# Patient Record
Sex: Male | Born: 1985 | Race: Black or African American | Hispanic: No | Marital: Single | Smoking: Former smoker
Health system: Southern US, Community
[De-identification: ages and names within clinical notes are randomized; demographics above are authoritative.]

## PROBLEM LIST (undated history)

## (undated) DIAGNOSIS — J45909 Unspecified asthma, uncomplicated: Secondary | ICD-10-CM

## (undated) DIAGNOSIS — R7303 Prediabetes: Secondary | ICD-10-CM

## (undated) HISTORY — DX: Unspecified asthma, uncomplicated: J45.909

## (undated) HISTORY — PX: NO PAST SURGERIES: SHX2092

---
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
## 2007-01-03 ENCOUNTER — Ambulatory Visit: Payer: Self-pay | Admitting: Internal Medicine

## 2007-01-03 ENCOUNTER — Encounter (INDEPENDENT_AMBULATORY_CARE_PROVIDER_SITE_OTHER): Payer: Self-pay | Admitting: Nurse Practitioner

## 2007-01-03 LAB — CONVERTED CEMR LAB
ALT: 20 units/L (ref 0–53)
AST: 13 units/L (ref 0–37)
Albumin: 4.5 g/dL (ref 3.5–5.2)
Alkaline Phosphatase: 65 units/L (ref 39–117)
BUN: 8 mg/dL (ref 6–23)
Basophils Absolute: 0.1 10*3/uL (ref 0.0–0.1)
Basophils Relative: 1 % (ref 0–1)
CO2: 24 meq/L (ref 19–32)
Calcium: 9.9 mg/dL (ref 8.4–10.5)
Chloride: 103 meq/L (ref 96–112)
Cholesterol: 189 mg/dL (ref 0–200)
Creatinine, Ser: 0.8 mg/dL (ref 0.40–1.50)
Eosinophils Absolute: 0.5 10*3/uL (ref 0.2–0.7)
Eosinophils Relative: 4 % (ref 0–5)
Glucose, Bld: 90 mg/dL (ref 70–99)
HCT: 46.9 % (ref 39.0–52.0)
HDL: 61 mg/dL (ref >39)
Hemoglobin: 15.4 g/dL (ref 13.0–17.0)
LDL Cholesterol: 87 mg/dL (ref 0–99)
Lymphocytes Relative: 20 % (ref 12–46)
Lymphs Abs: 2.6 10*3/uL (ref 0.7–4.0)
MCHC: 32.8 g/dL (ref 30.0–36.0)
MCV: 83.2 fL (ref 78.0–100.0)
Monocytes Absolute: 1 10*3/uL (ref 0.1–1.0)
Monocytes Relative: 8 % (ref 3–12)
Neutro Abs: 8.8 10*3/uL — ABNORMAL HIGH (ref 1.7–7.7)
Neutrophils Relative %: 68 % (ref 43–77)
Platelets: 287 10*3/uL (ref 150–400)
Potassium: 4.1 meq/L (ref 3.5–5.3)
RBC: 5.64 M/uL (ref 4.22–5.81)
RDW: 12.9 % (ref 11.5–15.5)
Sodium: 140 meq/L (ref 135–145)
TSH: 2.323 microintl units/mL (ref 0.350–5.50)
Total Bilirubin: 0.5 mg/dL (ref 0.3–1.2)
Total CHOL/HDL Ratio: 3.1
Total Protein: 7.8 g/dL (ref 6.0–8.3)
Triglycerides: 204 mg/dL — ABNORMAL HIGH (ref <150)
VLDL: 41 mg/dL — ABNORMAL HIGH (ref 0–40)
WBC: 13 10*3/uL — ABNORMAL HIGH (ref 4.0–10.5)

## 2007-01-15 ENCOUNTER — Ambulatory Visit: Payer: Self-pay | Admitting: *Deleted

## 2008-05-12 ENCOUNTER — Ambulatory Visit: Payer: Self-pay | Admitting: Internal Medicine

## 2009-03-16 ENCOUNTER — Ambulatory Visit: Payer: Self-pay | Admitting: Family Medicine

## 2013-02-21 ENCOUNTER — Ambulatory Visit (INDEPENDENT_AMBULATORY_CARE_PROVIDER_SITE_OTHER): Payer: BC Managed Care – PPO | Admitting: Family Medicine

## 2013-02-21 ENCOUNTER — Ambulatory Visit: Payer: BC Managed Care – PPO

## 2013-02-21 VITALS — BP 158/80 | HR 71 | Temp 98.3°F | Resp 16 | Ht 67.0 in | Wt 271.0 lb

## 2013-02-21 DIAGNOSIS — R079 Chest pain, unspecified: Secondary | ICD-10-CM

## 2013-02-21 DIAGNOSIS — R05 Cough: Secondary | ICD-10-CM

## 2013-02-21 DIAGNOSIS — R519 Headache, unspecified: Secondary | ICD-10-CM

## 2013-02-21 DIAGNOSIS — D72829 Elevated white blood cell count, unspecified: Secondary | ICD-10-CM

## 2013-02-21 DIAGNOSIS — R059 Cough, unspecified: Secondary | ICD-10-CM

## 2013-02-21 DIAGNOSIS — R0789 Other chest pain: Secondary | ICD-10-CM

## 2013-02-21 DIAGNOSIS — H538 Other visual disturbances: Secondary | ICD-10-CM

## 2013-02-21 DIAGNOSIS — R071 Chest pain on breathing: Secondary | ICD-10-CM

## 2013-02-21 DIAGNOSIS — R51 Headache: Secondary | ICD-10-CM

## 2013-02-21 LAB — POCT CBC
Granulocyte percent: 71.9 %G (ref 37–80)
HCT, POC: 45.6 % (ref 43.5–53.7)
Hemoglobin: 14.4 g/dL (ref 14.1–18.1)
Lymph, poc: 3.7 — AB (ref 0.6–3.4)
MCH, POC: 28.6 pg (ref 27–31.2)
MCHC: 31.6 g/dL — AB (ref 31.8–35.4)
MCV: 90.5 fL (ref 80–97)
MID (cbc): 0.8 (ref 0–0.9)
MPV: 9.6 fL (ref 0–99.8)
POC Granulocyte: 11.6 — AB (ref 2–6.9)
POC LYMPH PERCENT: 22.9 %L (ref 10–50)
POC MID %: 5.2 %M (ref 0–12)
Platelet Count, POC: 252 10*3/uL (ref 142–424)
RBC: 5.04 M/uL (ref 4.69–6.13)
RDW, POC: 13.1 %
WBC: 16.2 10*3/uL — AB (ref 4.6–10.2)

## 2013-02-21 LAB — GLUCOSE, POCT (MANUAL RESULT ENTRY): POC Glucose: 92 mg/dl (ref 70–99)

## 2013-02-21 NOTE — Patient Instructions (Signed)
Your blood count was slightly elevated, but no fever in office, chest xray appears ok.  This may be due to a viral illness, but will also discuss your symptoms with a neurologist. Recheck with Dr. Neva SeatGreene tomorrow morning to recheck your blood count, and other symptoms. Fluids, rest for now. If any worsening of chest pain, headache, further vision change, weakness, or any other new or worsening symptoms overnight - proceed directly to the emergency room. We may need to have you evaluated by an ophthalmologist tomorrow, but can discuss this further at that time.

## 2013-02-21 NOTE — Progress Notes (Signed)
Subjective:    Patient ID: Glenn Dorsey, male    DOB: 1985/10/12, 28 y.o.   MRN: [id number]  HPI Glenn Dorsey is a 28 y.o. male  "feverish symptoms on L side of body" hot and cold spells, coughing up mucus, runny nose/congestion on L. Started yesterday am. No measured fever. Some headache on L side and blurry vision on left - started yesterday at around 4:30am. Not watering, just blurry. No slurred speech. No arm/leg weakness, but warm/fever sensation into L leg. No coordination difficulty.  No nasal discharge.  Sore on L side of chest, but able to release this soreness with arm movement. No prior hx of headaches or migraines known. HA has improved some today, but blurry vision is same. No dizziness, no syncope.   Hx of asthma, no recent inhaler needed in past year or two.  No meds at home.   No rx meds.   Tx: otc cough medicine since yesterday and today. Unknown ingredients, no known hx of htn, and unknown if decongestant in cold med he is taking.   Did have flu vaccine this year.  SH: mom and dad with similar sx's.    There are no active problems to display for this patient.  Past Medical History  Diagnosis Date  . Asthma    History reviewed. No pertinent past surgical history. Not on File Prior to Admission medications   Medication Sig Start Date End Date Taking? Authorizing Provider  cetirizine (ZYRTEC) 10 MG tablet Take 10 mg by mouth daily.   Yes Historical Provider, MD   History   Social History  . Marital Status: Single    Spouse Name: N/A    Number of Children: N/A  . Years of Education: N/A   Occupational History  . Not on file.   Social History Main Topics  . Smoking status: Current Some Day Smoker  . Smokeless tobacco: Not on file  . Alcohol Use: No  . Drug Use: No  . Sexual Activity: Not on file   Other Topics Concern  . Not on file   Social History Narrative  . No narrative on file      Review of Systems  Constitutional: Positive for fever  (subjecive. ) and chills. Negative for appetite change.  HENT: Positive for congestion (on left only. ), hearing loss (hearing less at times on left side since yesterday at onset of other symptoms - feels congested. ) and sinus pressure. Negative for facial swelling and trouble swallowing.   Eyes: Positive for visual disturbance (blurry on left, no photophobia. ). Negative for pain, discharge and redness.  Respiratory: Positive for cough (clear -yellow mucus, mostly clear. ) and wheezing. Negative for shortness of breath.   Cardiovascular: Positive for chest pain (L sided only withour radiation, and reproducible with palpation, L shoulder mvmt. ). Negative for palpitations and leg swelling.  Genitourinary: Negative for difficulty urinating.  Musculoskeletal: Positive for myalgias (L side of chest, notes when elevating L arm overhead, relieves L ear sensation - feels like can hear better,  less blurry in  L eye and less headache with  arm elevation. ). Negative for neck stiffness.  Skin: Negative for rash.  Neurological: Positive for headaches. Negative for dizziness, tremors, seizures, syncope, facial asymmetry, speech difficulty, weakness, light-headedness and numbness.  fundoscopic exam difficult, but no apparent papilledema.      Objective:   Physical Exam  Vitals reviewed. Constitutional: He is oriented to person, place, and time. He appears well-developed and well-nourished.  HENT:  Head: Normocephalic and atraumatic.  Right Ear: Tympanic membrane, external ear and ear canal normal.  Left Ear: Tympanic membrane, external ear and ear canal normal.  Nose: No rhinorrhea.  Mouth/Throat: Oropharynx is clear and moist and mucous membranes are normal. No oropharyngeal exudate or posterior oropharyngeal erythema.  Eyes: Conjunctivae and EOM are normal. Pupils are equal, round, and reactive to light.  Neck: Trachea normal and full passive range of motion without pain. Neck supple. No JVD present.  No muscular tenderness present. Carotid bruit is not present. No Brudzinski's sign noted.  Cardiovascular: Normal rate, regular rhythm, normal heart sounds, intact distal pulses and normal pulses.   No extrasystoles are present.  No murmur heard. Diastolic murmurs: EKG:   Able to reproduce pain along R pectoralis/chest wall.   Pulmonary/Chest: Effort normal. No accessory muscle usage. Not tachypneic. No respiratory distress. He has no decreased breath sounds. He has wheezes (distant, end expiratory, lower. ). He has no rhonchi. He has no rales.  Abdominal: Soft. There is no tenderness.  Musculoskeletal: He exhibits no edema.  Lymphadenopathy:    He has no cervical adenopathy.  Neurological: He is alert and oriented to person, place, and time. He has normal strength. He displays no tremor. No cranial nerve deficit or sensory deficit. He displays a negative Romberg sign. Coordination and gait normal. GCS eye subscore is 4. GCS verbal subscore is 5. GCS motor subscore is 6.  [place] prehospital scale negative x 3. nonfocal exam, no apparent pronator drift, nl finger to nose, and heel to toe.   Skin: Skin is warm and dry. No rash noted.  Psychiatric: He has a normal mood and affect. His behavior is normal.   Filed Vitals:   02/21/13 1407  BP: 158/80  Pulse: 71  Temp: 98.3 F (36.8 C)  TempSrc: Oral  Resp: 16  Height: 5\' 7"  (1.702 m)  Weight: 271 lb (122.925 kg)  SpO2: 97%   Results for orders placed in visit on 02/21/13  POCT CBC      Result Value Ref Range   WBC 16.2 (*) 4.6 - 10.2 K/uL   Lymph, poc 3.7 (*) 0.6 - 3.4   POC LYMPH PERCENT 22.9  10 - 50 %L   MID (cbc) 0.8  0 - 0.9   POC MID % 5.2  0 - 12 %M   POC Granulocyte 11.6 (*) 2 - 6.9   Granulocyte percent 71.9  37 - 80 %G   RBC 5.04  4.69 - 6.13 M/uL   Hemoglobin 14.4  14.1 - 18.1 g/dL   HCT, POC 60.4  54.0 - 53.7 %   MCV 90.5  80 - 97 fL   MCH, POC 28.6  27 - 31.2 pg   MCHC 31.6 (*) 31.8 - 35.4 g/dL   RDW, POC 98.1       Platelet Count, POC 252  142 - 424 K/uL   MPV 9.6  0 - 99.8 fL  GLUCOSE, POCT (MANUAL RESULT ENTRY)      Result Value Ref Range   POC Glucose 92  70 - 99 mg/dl   [facility] reading (PRIMARY) by  Dr. Neva Seat: CXR: no apparent infiltrate. Stat overread obtained - normal CXR.   EKG: sr, early repol, without apparent acute changes.       Assessment & Plan:   Glenn Dorsey is a 27 y.o. male Headache(784.0) - Plan: EKG 12-Lead, POCT CBC, POCT glucose (manual entry)  Blurry vision - Plan: EKG 12-Lead, POCT CBC, POCT  glucose (manual entry)  Cough - Plan: DG Chest 2 View  Chest pain, unspecified - Plan: DG Chest 2 View  Left-sided chest wall pain  Left-sided headache  Leukocytosis, unspecified - Plan: DG Chest 2 View   Suspected viral syndrome with cough, myalgias, HA.  Afebrile, supple neck exam, reassuring CXR and EKG. Leukocytosis, but afebrile in office.  L sided vision change, L sided HA, L sided myalgias vs dysesthesias, but no focal deficit or focal neuro findings. Discussed with Neurologist after patient visit. DDX includes underlying MS with flair of sx's with viral illness.  Plan on discussing MRI, but recheck in am to recheck CBC and exam.    Meds ordered this encounter  Medications  . cetirizine (ZYRTEC) 10 MG tablet    Sig: Take 10 mg by mouth daily.   Patient Instructions  Your blood count was slightly elevated, but no fever in office, chest xray appears ok.  This may be due to a viral illness, but will also discuss your symptoms with a neurologist. Recheck with Dr. Neva Seat tomorrow morning to recheck your blood count, and other symptoms. Fluids, rest for now. If any worsening of chest pain, headache, further vision change, weakness, or any other new or worsening symptoms overnight - proceed directly to the emergency room. We may need to have you evaluated by an ophthalmologist tomorrow, but can discuss this further at that time.

## 2013-02-22 ENCOUNTER — Telehealth: Payer: Self-pay | Admitting: Family Medicine

## 2013-02-22 NOTE — Telephone Encounter (Signed)
Left message for patient to return call.  Call and check status. He was supposed to follow up with me today to repeat blood count and discuss his other symptoms form last night. I did call a neurologist about his symptoms and planned to discuss further workup of these as recommended by Neuro today. Can be seen tonight before 6pm or return in am, but let me know when he will return so I can advise other provider if I am not here.

## 2013-02-25 NOTE — Telephone Encounter (Signed)
Left message on machine to call.

## 2013-02-26 NOTE — Telephone Encounter (Signed)
Spoke with pt and he is planning on returning Thursday or Friday with Dr. Perrin MalteseGuest. He states he is starting to feel much better, but will recheck with you.

## 2013-03-04 ENCOUNTER — Ambulatory Visit (INDEPENDENT_AMBULATORY_CARE_PROVIDER_SITE_OTHER): Payer: BC Managed Care – PPO | Admitting: Internal Medicine

## 2013-03-04 VITALS — BP 138/82 | HR 77 | Temp 98.0°F | Resp 16 | Ht 67.0 in | Wt 263.0 lb

## 2013-03-04 DIAGNOSIS — R05 Cough: Secondary | ICD-10-CM

## 2013-03-04 DIAGNOSIS — R079 Chest pain, unspecified: Secondary | ICD-10-CM

## 2013-03-04 DIAGNOSIS — J40 Bronchitis, not specified as acute or chronic: Secondary | ICD-10-CM

## 2013-03-04 DIAGNOSIS — R059 Cough, unspecified: Secondary | ICD-10-CM

## 2013-03-04 MED ORDER — HYDROCODONE-ACETAMINOPHEN 7.5-325 MG/15ML PO SOLN: 5.0000 mL | Freq: Four times a day (QID) | ORAL | Status: DC | PRN

## 2013-03-04 MED ORDER — AZITHROMYCIN 500 MG PO TABS: 500.0000 mg | ORAL_TABLET | Freq: Every day | ORAL | Status: DC

## 2013-03-04 NOTE — Progress Notes (Signed)
   Subjective:    Patient ID: Glenn Dorsey, male    DOB: [birth year], 28 y.o.   MRN: [id number]  HPI    Review of Systems     Objective:   Physical Exam        Assessment & Plan:

## 2013-03-04 NOTE — Patient Instructions (Signed)
Acute Bronchitis Bronchitis is inflammation of the airways that extend from the windpipe into the lungs (bronchi). The inflammation often causes mucus to develop. This leads to a cough, which is the most common symptom of bronchitis.  In acute bronchitis, the condition usually develops suddenly and goes away over time, usually in a couple weeks. Smoking, allergies, and asthma can make bronchitis worse. Repeated episodes of bronchitis may cause further lung problems.  CAUSES Acute bronchitis is most often caused by the same virus that causes a cold. The virus can spread from person to person (contagious).  SIGNS AND SYMPTOMS   Cough.   Fever.   Coughing up mucus.   Body aches.   Chest congestion.   Chills.   Shortness of breath.   Sore throat.  DIAGNOSIS  Acute bronchitis is usually diagnosed through a physical exam. Tests, such as chest X-rays, are sometimes done to rule out other conditions.  TREATMENT  Acute bronchitis usually goes away in a couple weeks. Often times, no medical treatment is necessary. Medicines are sometimes given for relief of fever or cough. Antibiotics are usually not needed but may be prescribed in certain situations. In some cases, an inhaler may be recommended to help reduce shortness of breath and control the cough. A cool mist vaporizer may also be used to help thin bronchial secretions and make it easier to clear the chest.  HOME CARE INSTRUCTIONS  Get plenty of rest.   Drink enough fluids to keep your urine clear or pale yellow (unless you have a medical condition that requires fluid restriction). Increasing fluids may help thin your secretions and will prevent dehydration.   Only take over-the-counter or prescription medicines as directed by your health care provider.   Avoid smoking and secondhand smoke. Exposure to cigarette smoke or irritating chemicals will make bronchitis worse. If you are a smoker, consider using nicotine gum or skin  patches to help control withdrawal symptoms. Quitting smoking will help your lungs heal faster.   Reduce the chances of another bout of acute bronchitis by washing your hands frequently, avoiding people with cold symptoms, and trying not to touch your hands to your mouth, nose, or eyes.   Follow up with your health care provider as directed.  SEEK MEDICAL CARE IF: Your symptoms do not improve after 1 week of treatment.  SEEK IMMEDIATE MEDICAL CARE IF:  You develop an increased fever or chills.   You have chest pain.   You have severe shortness of breath.  You have bloody sputum.   You develop dehydration.  You develop fainting.  You develop repeated vomiting.  You develop a severe headache. MAKE SURE YOU:   Understand these instructions.  Will watch your condition.  Will get help right away if you are not doing well or get worse. Document Released: 02/04/2004 Document Revised: 08/29/2012 Document Reviewed: 06/19/2012 [employer] Patient Information 2014 [employer].  

## 2013-03-04 NOTE — Progress Notes (Signed)
   Subjective:    Patient ID: Glenn Dorsey, male    DOB: 06-Oct-1985, 28 y.o.   MRN: [id number]  HPI Pt is here for a follow-up, he was seen by Dr. Neva SeatGreene on the 12th. He was originally seen for "chest congestion" and fever, and was noted to have a high WBC. He is still coughing quite a bit, he thinks the congestion is concentrated on the left side of his chest. He states this cough is productive, it was yellow tinged a couple of days ago but has since become clear. He states this coughing is interrupting his sleep. He states he has noticed some shortness of breath, especially with activity. He says this was worse a week ago, but is still bothering him some. He has noticed some pain in his ribs, that he believes is associated to coughing. Pt states he wants an antibiotic, he thinks it has continued for too long. He has taken castor oil, to "help clean his system". He has been taking mucinex, he states this has helped some.  Pt states the blurred vision he was experiencing at the last visit, seems to have resolved.   Glucose 92, wbc 16,000 last week, cxr normal   Review of Systems     Objective:   Physical Exam  Vitals reviewed. Constitutional: He is oriented to person, place, and time. He appears well-developed and well-nourished. No distress.  HENT:  Head: Normocephalic.  Right Ear: External ear normal.  Left Ear: External ear normal.  Nose: Nose normal.  Mouth/Throat: Oropharynx is clear and moist.  Eyes: Conjunctivae and EOM are normal. Pupils are equal, round, and reactive to light.  Neck: Normal range of motion. Neck supple.  Cardiovascular: Normal rate, regular rhythm and normal heart sounds.   Pulmonary/Chest: Effort normal. Not tachypneic. He has no decreased breath sounds. He has no wheezes. He has rhonchi. He has no rales.  Lymphadenopathy:    He has no cervical adenopathy.  Neurological: He is alert and oriented to person, place, and time. He exhibits normal muscle tone.  Coordination normal.          Assessment & Plan:  Bronchitis Zith 500mg Leandro Reasoner/lortab

## 2013-03-07 ENCOUNTER — Telehealth: Payer: Self-pay

## 2013-03-07 DIAGNOSIS — R059 Cough, unspecified: Secondary | ICD-10-CM

## 2013-03-07 DIAGNOSIS — R05 Cough: Secondary | ICD-10-CM

## 2013-03-07 NOTE — Telephone Encounter (Signed)
Patient states that at his last visit he did not have enough money to get his hydrocodone filled and Dr. Perrin MalteseGuest rewrote his RX for half of the amount so that patient could go ahead and purchase it. Patient states that he needs a refill on the next dose and would like to try and get it by Friday.  [phone]

## 2013-03-10 MED ORDER — HYDROCODONE-ACETAMINOPHEN 7.5-325 MG/15ML PO SOLN: 10.0000 mL | Freq: Four times a day (QID) | ORAL | Status: DC | PRN

## 2013-03-12 ENCOUNTER — Ambulatory Visit (INDEPENDENT_AMBULATORY_CARE_PROVIDER_SITE_OTHER): Payer: BC Managed Care – PPO | Admitting: Internal Medicine

## 2013-03-12 VITALS — BP 135/80 | HR 58 | Temp 98.2°F | Resp 18 | Ht 67.25 in | Wt 265.0 lb

## 2013-03-12 DIAGNOSIS — J40 Bronchitis, not specified as acute or chronic: Secondary | ICD-10-CM

## 2013-03-12 NOTE — Progress Notes (Signed)
   Subjective:    Patient ID: Glenn Dorsey, male    DOB: Jan 24, 1985, 28 y.o.   MRN: [id number]  HPI 28 year old male present for an acute bronchitis re-check that he was diagnosed with last Monday. He says he is feeling around 90-95 percent better. The cough has pretty much gone away. He states he has a little chest congestion left, but it's not bothering him too much. Overall he says he is feeling good; no other concerns.    Review of Systems     Objective:   Physical Exam  Constitutional: He is oriented to person, place, and time. He appears well-developed and well-nourished.  HENT:  Head: Normocephalic.  Right Ear: External ear normal.  Left Ear: External ear normal.  Nose: Nose normal.  Mouth/Throat: Oropharynx is clear and moist.  Eyes: EOM are normal. Pupils are equal, round, and reactive to light.  Neck: Normal range of motion. Neck supple.  Cardiovascular: Normal rate, regular rhythm and normal heart sounds.   Pulmonary/Chest: Effort normal. Not tachypneic. He has no decreased breath sounds. He has no wheezes. He has rhonchi. He has no rales.  Lymphadenopathy:    He has no cervical adenopathy.  Neurological: He is alert and oriented to person, place, and time. He exhibits normal muscle tone. Coordination normal.  Psychiatric: He has a normal mood and affect.          Assessment & Plan:  Resolving bronchitis

## 2013-03-12 NOTE — Progress Notes (Signed)
   Subjective:    Patient ID: Glenn Dorsey, male    DOB: 04/08/1985, 27 y.o.   MRN: [id number]  HPI    Review of Systems     Objective:   Physical Exam        Assessment & Plan:   

## 2013-03-18 ENCOUNTER — Ambulatory Visit (INDEPENDENT_AMBULATORY_CARE_PROVIDER_SITE_OTHER): Payer: BC Managed Care – PPO | Admitting: Family Medicine

## 2013-03-18 VITALS — BP 150/80 | HR 87 | Temp 99.1°F | Resp 16 | Ht 67.5 in | Wt 265.0 lb

## 2013-03-18 DIAGNOSIS — K0889 Other specified disorders of teeth and supporting structures: Secondary | ICD-10-CM

## 2013-03-18 DIAGNOSIS — K029 Dental caries, unspecified: Secondary | ICD-10-CM

## 2013-03-18 DIAGNOSIS — K089 Disorder of teeth and supporting structures, unspecified: Secondary | ICD-10-CM

## 2013-03-18 DIAGNOSIS — R6884 Jaw pain: Secondary | ICD-10-CM

## 2013-03-18 LAB — POCT CBC
Granulocyte percent: 70.8 %G (ref 37–80)
HCT, POC: 43.9 % (ref 43.5–53.7)
Hemoglobin: 14.2 g/dL (ref 14.1–18.1)
Lymph, poc: 2.9 (ref 0.6–3.4)
MCH, POC: 28.3 pg (ref 27–31.2)
MCHC: 32.3 g/dL (ref 31.8–35.4)
MCV: 87.6 fL (ref 80–97)
MID (cbc): 0.6 (ref 0–0.9)
MPV: 9.5 fL (ref 0–99.8)
POC Granulocyte: 8.6 — AB (ref 2–6.9)
POC LYMPH PERCENT: 24.3 % (ref 10–50)
POC MID %: 4.9 %M (ref 0–12)
Platelet Count, POC: 285 10*3/uL (ref 142–424)
RBC: 5.01 M/uL (ref 4.69–6.13)
RDW, POC: 13 %
WBC: 12.1 10*3/uL — AB (ref 4.6–10.2)

## 2013-03-18 MED ORDER — MAGIC MOUTHWASH W/LIDOCAINE: 5.0000 mL | Freq: Three times a day (TID) | ORAL | Status: DC | PRN

## 2013-03-18 MED ORDER — AMOXICILLIN 500 MG PO CAPS: 500.0000 mg | ORAL_CAPSULE | Freq: Three times a day (TID) | ORAL | Status: DC

## 2013-03-18 NOTE — Patient Instructions (Signed)
Dental Caries   Dental caries (also called tooth decay) is the most common oral disease. It can occur at any age, but is more common in children and young adults.   HOW DENTAL CARIES DEVELOPS   The process of decay begins when bacteria and foods (particularly sugars and starches) combine in your mouth to produce plaque. Plaque is a substance that sticks to the hard, outer surface of a tooth (enamel). The bacteria in plaque produce acids that attack enamel. These acids may also attack the root surface of a tooth (cementum) if it is exposed. Repeated attacks dissolve these surfaces and create holes in the tooth (cavities). If left untreated, the acids destroy the other layers of the tooth.   RISK FACTORS  · Frequent sipping of sugary beverages.    · Frequent snacking on sugary and starchy foods, especially those that easily get stuck in the teeth.    · Poor oral hygiene.    · Dry mouth.    · Substance abuse such as methamphetamine abuse.    · Broken or poor-fitting dental restorations.    · Eating disorders.    · Gastroesophageal reflux disease (GERD).    · Certain radiation treatments to the head and neck.  SYMPTOMS  In the early stages of dental caries, symptoms are seldom present. Sometimes white, chalky areas may be seen on the enamel or other tooth layers. In later stages, symptoms may include:  · Pits and holes on the enamel.  · Toothache after sweet, hot, or cold foods or drinks are consumed.  · Pain around the tooth.  · Swelling around the tooth.  DIAGNOSIS   Most of the time, dental caries is detected during a regular dental checkup. A diagnosis is made after a thorough medical and dental history is taken and the surfaces of your teeth are checked for signs of dental caries. Sometimes special instruments, such as lasers, are used to check for dental caries. Dental X-ray exams may be taken so that areas not visible to the eye (such as between the contact areas of the teeth) can be checked for cavities.    TREATMENT   If dental caries is in its early stages, it may be reversed with a fluoride treatment or an application of a remineralizing agent at the dental office. Thorough brushing and flossing at home is needed to aid these treatments. If it is in its later stages, treatment depends on the location and extent of tooth destruction:   · If a small area of the tooth has been destroyed, the destroyed area will be removed and cavities will be filled with a material such as gold, silver amalgam, or composite resin.    · If a large area of the tooth has been destroyed, the destroyed area will be removed and a cap (crown) will be fitted over the remaining tooth structure.    · If the center part of the tooth (pulp) is affected, a procedure called a root canal will be needed before a filling or crown can be placed.    · If most of the tooth has been destroyed, the tooth may need to be pulled (extracted).  HOME CARE INSTRUCTIONS  You can prevent, stop, or reverse dental caries at home by practicing good oral hygiene. Good oral hygiene includes:  · Thoroughly cleaning your teeth at least twice a day with a toothbrush and dental floss.    · Using a fluoride toothpaste. A fluoride mouth rinse may also be used if recommended by your dentist or health care provider.    ·   Restricting the amount of sugary and starchy foods and sugary liquids you consume.    · Avoiding frequent snacking on these foods and sipping of these liquids.    · Keeping regular visits with a dentist for checkups and cleanings.  PREVENTION   · Practice good oral hygiene.  · Consider a dental sealant. A dental sealant is a coating material that is applied by your dentist to the pits and grooves of teeth. The sealant prevents food from being trapped in them. It may protect the teeth for several years.  · Ask about fluoride supplements if you live in a community without fluorinated water or with water that has a low fluoride content. Use fluoride supplements  as directed by your dentist or health care provider.  · Allow fluoride varnish applications to teeth if directed by your dentist or health care provider.  Document Released: 09/18/2001 Document Revised: 08/29/2012 Document Reviewed: 12/30/2011  [employer]® Patient Information ©2014 [employer].

## 2013-03-18 NOTE — Progress Notes (Signed)
Chief Complaint:  Chief Complaint  Patient presents with  . Dental Pain    left side x 3 day  . Rash  . Fatigue    HPI: Glenn Dorsey is a 28 y.o. male who is here for  3 day hx of left sided denta pain, jaw pain, and metallic taste in mouth  It goes down his mouth and into his stoamch. He has not had fevers or chills but feels flushed. Sensitive tooth  with liquids  Past Medical History  Diagnosis Date  . Asthma    History reviewed. No pertinent past surgical history. History   Social History  . Marital Status: Single    Spouse Name: N/A    Number of Children: N/A  . Years of Education: N/A   Social History Main Topics  . Smoking status: Current Some Day Smoker  . Smokeless tobacco: None  . Alcohol Use: No  . Drug Use: No  . Sexual Activity: None   Other Topics Concern  . None   Social History Narrative  . None   Family History  Problem Relation Age of Onset  . Diabetes Mother   . Diabetes Father    No Known Allergies Prior to Admission medications   Medication Sig Start Date End Date Taking? Authorizing Provider  azithromycin (ZITHROMAX) 500 MG tablet Take 1 tablet (500 mg total) by mouth daily. 03/04/13   Jonita Albeehris W Guest, MD  cetirizine (ZYRTEC) 10 MG tablet Take 10 mg by mouth daily.    Historical Provider, MD  HYDROcodone-acetaminophen (HYCET) 7.5-325 mg/15 ml solution Take 5 mLs by mouth every 6 (six) hours as needed (or cough). 03/04/13   Jonita Albeehris W Guest, MD  HYDROcodone-acetaminophen (HYCET) 7.5-325 mg/15 ml solution Take 10-15 mLs by mouth every 6 (six) hours as needed. 03/10/13   Jonita Albeehris W Guest, MD     ROS: The patient denies fevers, chills, night sweats, unintentional weight loss, chest pain, palpitations, wheezing, dyspnea on exertion, nausea, vomiting, abdominal pain, dysuria, hematuria, melena, numbness, weakness, or tingling.   All other systems have been reviewed and were otherwise negative with the exception of those mentioned in the HPI and as  above.    PHYSICAL EXAM: Filed Vitals:   03/18/13 1057  BP: 150/80  Pulse: 87  Temp: 99.1 F (37.3 C)  Resp: 16   Filed Vitals:   03/18/13 1057  Height: 5' 7.5" (1.715 m)  Weight: 265 lb (120.203 kg)   Body mass index is 40.87 kg/(m^2).  General: Alert, no acute distress HEENT:  Normocephalic, atraumatic, oropharynx patent. EOMI, PERRLA, Tm nl, + left molar dental caries, large; no e/o abscess currently, jaw is not warm , no dc Cardiovascular:  Regular rate and rhythm, no rubs murmurs or gallops.  No Carotid bruits, radial pulse intact. No pedal edema.  Respiratory: Clear to auscultation bilaterally.  No wheezes, rales, or rhonchi.  No cyanosis, no use of accessory musculature GI: No organomegaly, abdomen is soft and non-tender, positive bowel sounds.  No masses. Skin: No rashes. Neurologic: Facial musculature symmetric. Psychiatric: Patient is appropriate throughout our interaction. Lymphatic: No cervical lymphadenopathy Musculoskeletal: Gait intact.   LABS: Results for orders placed in visit on 03/18/13  POCT CBC      Result Value Ref Range   WBC 12.1 (*) 4.6 - 10.2 K/uL   Lymph, poc 2.9  0.6 - 3.4   POC LYMPH PERCENT 24.3  10 - 50 %L   MID (cbc) 0.6  0 - 0.9  POC MID % 4.9  0 - 12 %M   POC Granulocyte 8.6 (*) 2 - 6.9   Granulocyte percent 70.8  37 - 80 %G   RBC 5.01  4.69 - 6.13 M/uL   Hemoglobin 14.2  14.1 - 18.1 g/dL   HCT, POC 16.1  09.6 - 53.7 %   MCV 87.6  80 - 97 fL   MCH, POC 28.3  27 - 31.2 pg   MCHC 32.3  31.8 - 35.4 g/dL   RDW, POC 04.5     Platelet Count, POC 285  142 - 424 K/uL   MPV 9.5  0 - 99.8 fL     EKG/XRAY:   Primary read interpreted by Dr. Conley Rolls at [facility].   ASSESSMENT/PLAN: Encounter Diagnoses  Name Primary?  Marland Kitchen Tooth pain Yes  . Jaw pain    ? Tooth decay/  Early formation abscess Rx magic mouthwash Rx Amoxcaillin 500 mg TID x 10 days, go see dentist F/u prn  Gross sideeffects, risk and benefits, and alternatives of medications  d/w patient. Patient is aware that all medications have potential sideeffects and we are unable to predict every sideeffect or drug-drug interaction that may occur.  Hamilton Capri PHUONG, DO 03/18/2013 12:26 PM

## 2013-06-05 NOTE — Telephone Encounter (Signed)
Opened in error

## 2016-01-04 IMAGING — CR DG CHEST 2V
2 series · 2 of 2 positions shown · non-contrast
Comparison: None.

CLINICAL DATA: Fever, cough, congestion

EXAM:
CHEST  2 VIEW

[PA]
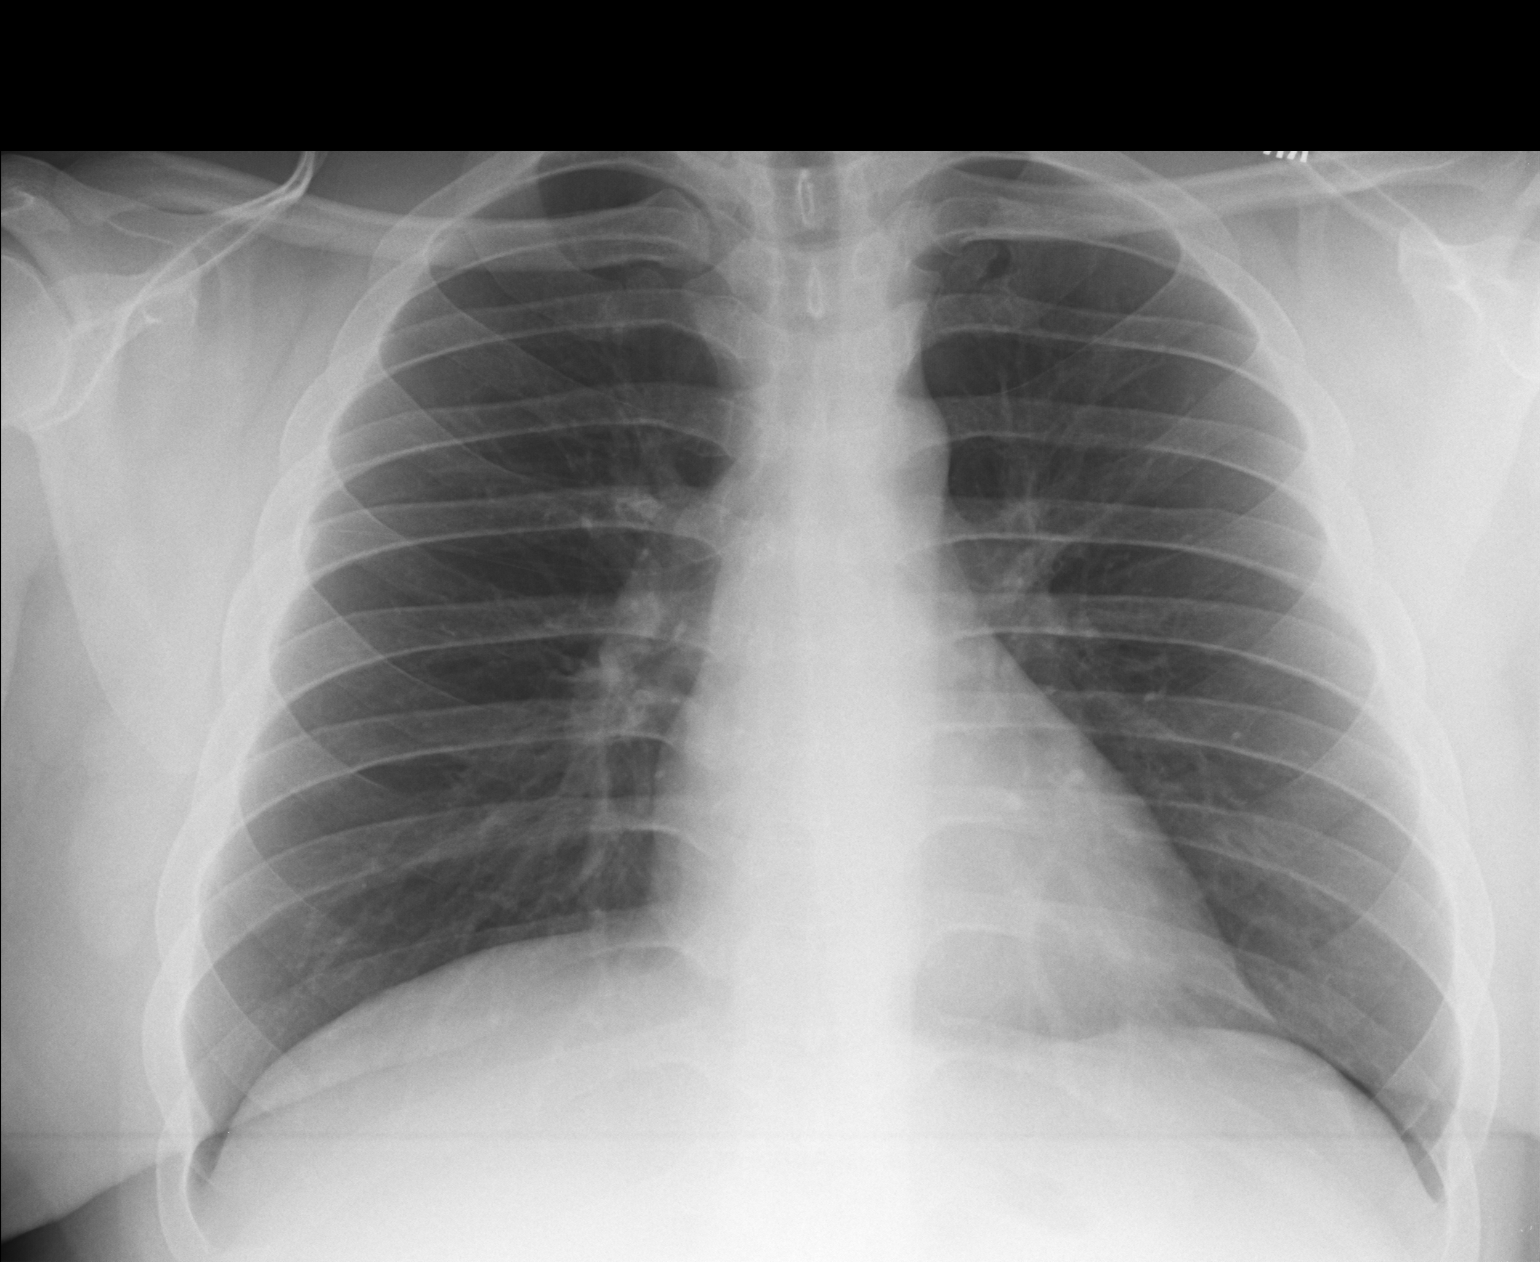

[lateral]
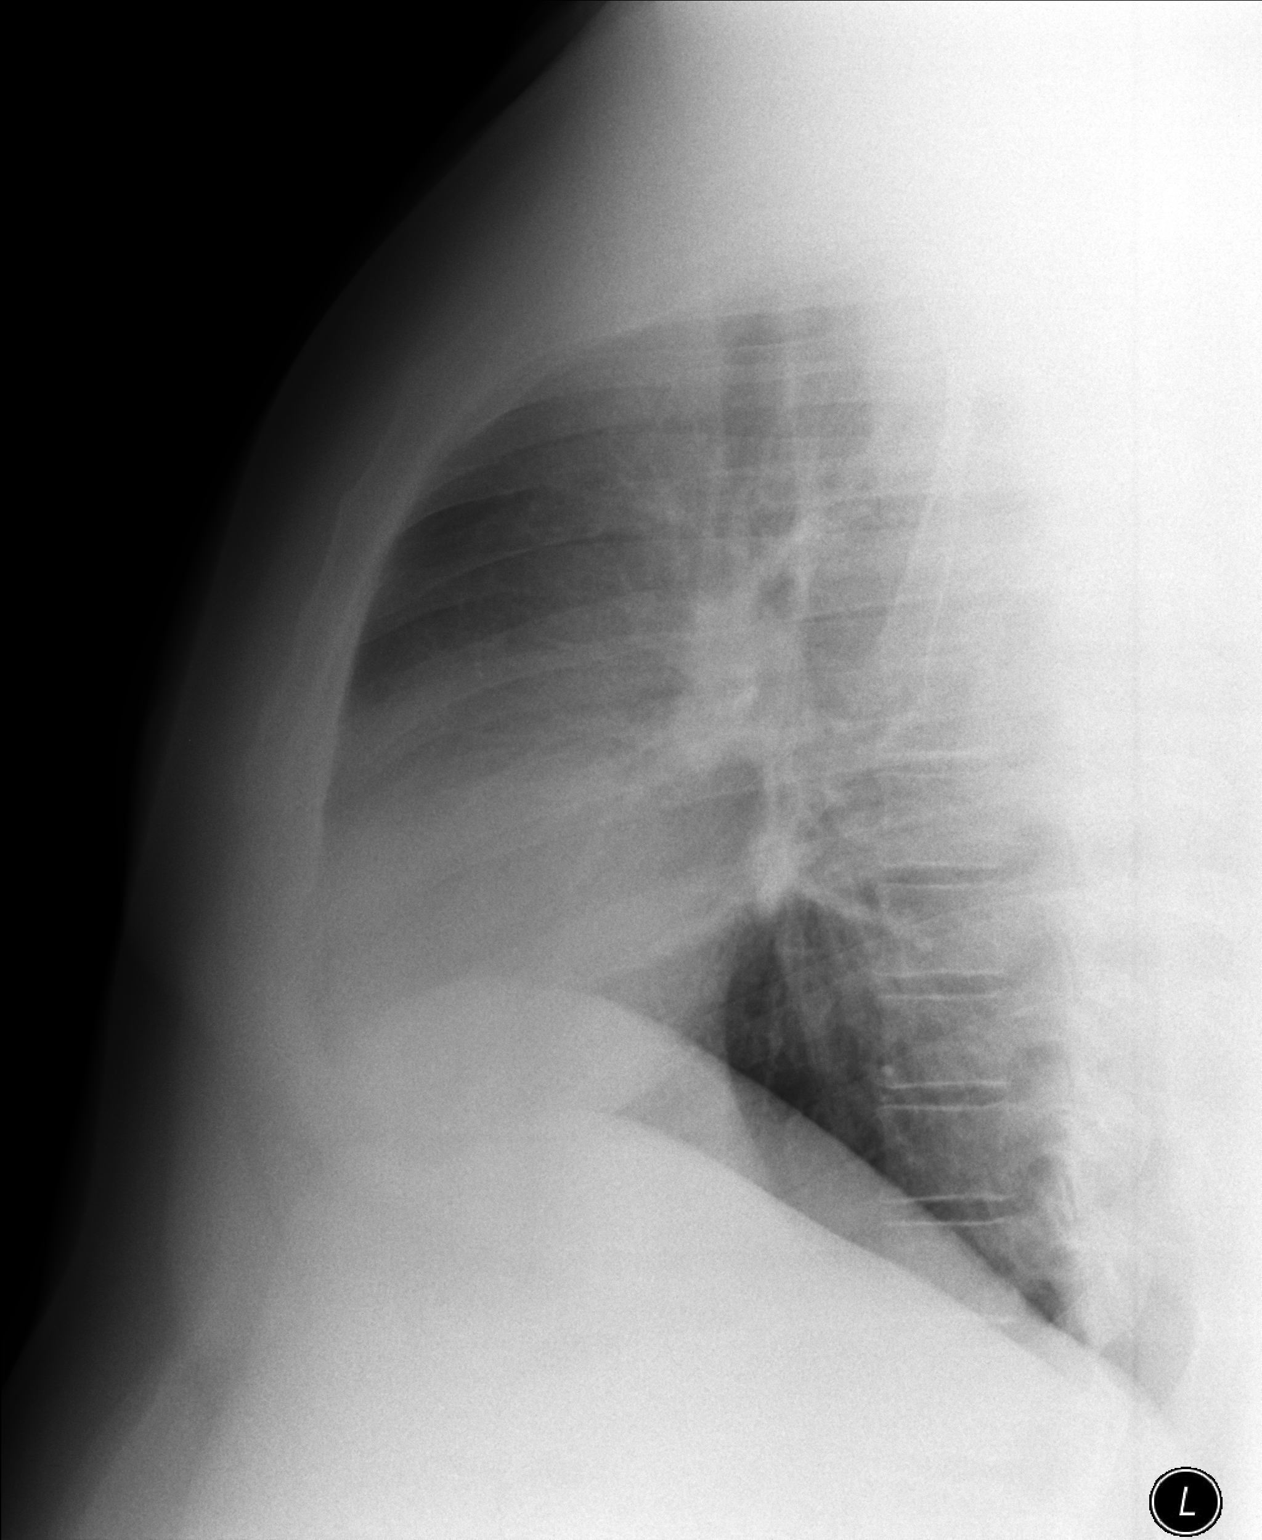

[2 of 2 positions shown; findings below may reference images not displayed]

FINDINGS: Lungs are clear.  No pleural effusion or pneumothorax.

The heart is normal in size.

Visualized osseous structures are within normal limits.
IMPRESSION: Normal chest radiographs.

## 2021-06-09 ENCOUNTER — Encounter (HOSPITAL_COMMUNITY): Payer: Self-pay | Admitting: Emergency Medicine

## 2021-06-09 ENCOUNTER — Other Ambulatory Visit: Payer: Self-pay

## 2021-06-09 ENCOUNTER — Emergency Department (HOSPITAL_COMMUNITY)
Admission: EM | Admit: 2021-06-09 | Discharge: 2021-06-09 | Disposition: A | Discharge status: Home / Self Care | Payer: Self-pay | Attending: Emergency Medicine | Admitting: Emergency Medicine

## 2021-06-09 DIAGNOSIS — J029 Acute pharyngitis, unspecified: Secondary | ICD-10-CM | POA: Insufficient documentation

## 2021-06-09 DIAGNOSIS — R059 Cough, unspecified: Secondary | ICD-10-CM | POA: Insufficient documentation

## 2021-06-09 DIAGNOSIS — R630 Anorexia: Secondary | ICD-10-CM | POA: Insufficient documentation

## 2021-06-09 DIAGNOSIS — Z20822 Contact with and (suspected) exposure to covid-19: Secondary | ICD-10-CM | POA: Insufficient documentation

## 2021-06-09 DIAGNOSIS — R5383 Other fatigue: Secondary | ICD-10-CM | POA: Insufficient documentation

## 2021-06-09 LAB — GROUP A STREP BY PCR: Group A Strep by PCR: NOT DETECTED

## 2021-06-09 LAB — SARS CORONAVIRUS 2 BY RT PCR: SARS Coronavirus 2 by RT PCR: NEGATIVE

## 2021-06-09 LAB — POC SARS CORONAVIRUS 2 AG -  ED: SARSCOV2ONAVIRUS 2 AG: NEGATIVE

## 2021-06-09 MED ORDER — PREDNISONE 20 MG PO TABS
60.0000 mg | ORAL_TABLET | Freq: Once | ORAL | Status: AC
Start: 2021-06-09 — End: 2021-06-09
  Administered 2021-06-09: 60 mg via ORAL
  Filled 2021-06-09: qty 3

## 2021-06-09 MED ORDER — ACETAMINOPHEN 325 MG PO TABS
650.0000 mg | ORAL_TABLET | Freq: Once | ORAL | Status: AC | PRN
  Administered 2021-06-09: 650 mg via ORAL
  Filled 2021-06-09: qty 2

## 2021-06-09 NOTE — ED Provider Notes (Signed)
[facility]-EMERGENCY DEPT Provider Note   CSN: [id number] Arrival date & time: 06/09/21  1440     History  Chief Complaint  Patient presents with   Fatigue   Cough   Sore Throat    Glenn Dorsey is a 36 y.o. male.  36 y.o male with no PMH presents to the ED with a chief complaint of sore throat, cough, fatigue for the past 4 days.  Patient endorses severe sore throat, feels that it is very irritated and painful whenever he tries to swallow.  He also endorses a mild cough, "like clearing his throat ".  He also reports feeling overall some anorexia, has not had much of an appetite in the last couple days.  He does feel that symptoms are similar to he had strep pharyngitis previously.  He does have 2 small children at home of age 99 and 77.  He also endorses taking Tylenol with some improvement in his symptoms.  He denies any chills, chest pain, shortness of breath.  Denies any tobacco use.  The history is provided by the patient and medical records.  Cough Cough characteristics:  Productive and dry Associated symptoms: sore throat   Associated symptoms: no chest pain, no chills, no fever and no shortness of breath   Sore Throat Pertinent negatives include no chest pain, no abdominal pain and no shortness of breath.      Home Medications Prior to Admission medications   Medication Sig Start Date End Date Taking? Authorizing Provider  Alum & Mag Hydroxide-Simeth (MAGIC MOUTHWASH W/LIDOCAINE) SOLN Take 5 mLs by mouth 3 (three) times daily as needed for mouth pain. Swish and spit 03/18/13   Le, Thao P, DO  amoxicillin (AMOXIL) 500 MG capsule Take 1 capsule (500 mg total) by mouth 3 (three) times daily. 03/18/13   Le, Thao P, DO  cetirizine (ZYRTEC) 10 MG tablet Take 10 mg by mouth daily.    [provider]      Allergies    Patient has no known allergies.    Review of Systems   Review of Systems  Constitutional:  Negative for chills and fever.  HENT:   Positive for sore throat.   Respiratory:  Positive for cough. Negative for shortness of breath.   Cardiovascular:  Negative for chest pain.  Gastrointestinal:  Negative for abdominal pain.  Genitourinary:  Negative for flank pain.   Physical Exam Updated Vital Signs BP (!) 181/113 (BP Location: Right Arm)   Pulse (!) 111   Temp 99.1 F (37.3 C) (Oral)   Resp 18   Ht 5\' 7"  (1.702 m)   Wt 131.5 kg   SpO2 96%   BMI 45.42 kg/m  Physical Exam Vitals and nursing note reviewed.  Constitutional:      Appearance: He is well-developed.  HENT:     Head: Normocephalic and atraumatic.     Nose: No congestion or rhinorrhea.     Mouth/Throat:     Mouth: Mucous membranes are moist.     Pharynx: Uvula midline. Posterior oropharyngeal erythema present. No oropharyngeal exudate or uvula swelling.     Tonsils: No tonsillar exudate or tonsillar abscesses. 1+ on the right. 1+ on the left.     Comments: Oropharynx is clear, with some mild erythema noted.  No tonsillar exudates noted bilaterally Cardiovascular:     Rate and Rhythm: Normal rate.  Pulmonary:     Effort: Pulmonary effort is normal.  Abdominal:     Palpations: Abdomen is  soft.     Tenderness: There is no abdominal tenderness.  Musculoskeletal:     Cervical back: Normal range of motion and neck supple.  Skin:    General: Skin is warm and dry.  Neurological:     Mental Status: He is alert and oriented to person, place, and time.    ED Results / Procedures / Treatments   Labs (all labs ordered are listed, but only abnormal results are displayed) Labs Reviewed  GROUP A STREP BY PCR  POC SARS CORONAVIRUS 2 AG -  ED    EKG None  Radiology No results found.  Procedures Procedures    Medications Ordered in ED Medications  predniSONE (DELTASONE) tablet 60 mg (has no administration in time range)  acetaminophen (TYLENOL) tablet 650 mg (650 mg Oral Given 06/09/21 1457)    ED Course/ Medical Decision Making/ A&P                            Medical Decision Making Risk OTC drugs. Prescription drug management.   Patient presents to the ED with a chief complaint of sore throat x 3 days. Patient reports similar symptoms when he had strep pharyngitis in the previous years.  He does have 2 small children at home.  He is febrile with a temperature of 99.1, strep pharyngitis test obtained while in the waiting room.  This was negative from today's visit.  During evaluation patient appears hemodynamically stable, blood pressure noted to be slightly elevated on today's visit.  He is in a low temp at 99.1, has been taking Tylenol for symptomatic control.  No hypoxia, lungs are slightly diminished to auscultation.  Oropharynx is clear although erythematous but without any obvious tonsillar exudate, uvula is midline he is tolerating his secretions.  No pain along his chest.  Did discuss COVID-19 swab on today's visit.   Strep pharyngitis test is negative on today's visit.  Patient did have a COVID-19 swab obtained, no results have returned, however he is requesting discharge home.  Did discuss with him a short course of Deltasone to help with symptomatic control.  We will go home with outpatient symptomatic treatment.  Patient will be called if test is positive.   Portions of this note were generated with Scientist, clinical (histocompatibility and immunogenetics). Dictation errors may occur despite best attempts at proofreading.   Final Clinical Impression(s) / ED Diagnoses Final diagnoses:  Sore throat    Rx / DC Orders ED Discharge Orders     None         Claude Manges, PA-C 06/09/21 1723    Melene Plan, DO 06/10/21 [phone]

## 2021-06-09 NOTE — ED Triage Notes (Signed)
Patient presents with complaints of throat pain which makes it difficult to swallow, cough and fatigue for 3-4 days. He states Tylenol helps.

## 2021-06-09 NOTE — Discharge Instructions (Addendum)
Your strep pharyngitis test was negative on today's visit.  You were provided with a short dose of steroid to help with your swelling along with pain along your throat.  You will receive a call by me if your COVID-19 results are positive.  Please continue symptomatic treatment with over-the-counter medication.

## 2022-08-08 ENCOUNTER — Other Ambulatory Visit: Payer: Self-pay

## 2022-08-08 ENCOUNTER — Encounter (HOSPITAL_COMMUNITY): Payer: Self-pay | Admitting: Orthopedic Surgery

## 2022-08-08 NOTE — Progress Notes (Signed)
Mr. Jaco denies chest pain or shortness of breath.  Patient denies having any s/s of Covid in his household, also denies any known exposure to Covid. Mr. Amador Cunas denies  any s/s of upper or lower respiratory in the past 8 weeks.  Mr. Danese PCP is Fredonia Highland, NP, Family Practice.

## 2022-08-10 ENCOUNTER — Ambulatory Visit (HOSPITAL_COMMUNITY): Payer: BC Managed Care – PPO | Admitting: Anesthesiology

## 2022-08-10 ENCOUNTER — Ambulatory Visit (HOSPITAL_COMMUNITY)
Admission: RE | Admit: 2022-08-10 | Discharge: 2022-08-10 | Disposition: A | Discharge status: Home / Self Care | Payer: BC Managed Care – PPO | Attending: Orthopedic Surgery | Admitting: Orthopedic Surgery

## 2022-08-10 ENCOUNTER — Encounter (HOSPITAL_COMMUNITY)
Admission: RE | Disposition: A | Discharge status: Home / Self Care | Payer: Self-pay | Source: Home / Self Care | Attending: Orthopedic Surgery

## 2022-08-10 ENCOUNTER — Encounter (HOSPITAL_COMMUNITY): Payer: Self-pay | Admitting: Orthopedic Surgery

## 2022-08-10 ENCOUNTER — Other Ambulatory Visit: Payer: Self-pay

## 2022-08-10 DIAGNOSIS — Z87891 Personal history of nicotine dependence: Secondary | ICD-10-CM | POA: Diagnosis not present

## 2022-08-10 DIAGNOSIS — Z6841 Body Mass Index (BMI) 40.0 and over, adult: Secondary | ICD-10-CM | POA: Diagnosis not present

## 2022-08-10 DIAGNOSIS — M67432 Ganglion, left wrist: Secondary | ICD-10-CM | POA: Diagnosis present

## 2022-08-10 HISTORY — DX: Prediabetes: R73.03

## 2022-08-10 HISTORY — PX: GANGLION CYST EXCISION: SHX1691

## 2022-08-10 LAB — COMPREHENSIVE METABOLIC PANEL
ALT: 26 U/L (ref 0–44)
AST: 23 U/L (ref 15–41)
Albumin: 4 g/dL (ref 3.5–5.0)
Alkaline Phosphatase: 51 U/L (ref 38–126)
Anion gap: 11 (ref 5–15)
BUN: 7 mg/dL (ref 6–20)
CO2: 24 mmol/L (ref 22–32)
Calcium: 9.1 mg/dL (ref 8.9–10.3)
Chloride: 102 mmol/L (ref 98–111)
Creatinine, Ser: 0.83 mg/dL (ref 0.61–1.24)
GFR, Estimated: >60 mL/min (ref >60)
Glucose, Bld: 96 mg/dL (ref 70–99)
Potassium: 4 mmol/L (ref 3.5–5.1)
Sodium: 137 mmol/L (ref 135–145)
Total Bilirubin: 0.8 mg/dL (ref 0.3–1.2)
Total Protein: 7.5 g/dL (ref 6.5–8.1)

## 2022-08-10 LAB — CBC
HCT: 44.5 % (ref 39.0–52.0)
Hemoglobin: 14.3 g/dL (ref 13.0–17.0)
MCH: 27.1 pg (ref 26.0–34.0)
MCHC: 32.1 g/dL (ref 30.0–36.0)
MCV: 84.4 fL (ref 80.0–100.0)
Platelets: 286 10*3/uL (ref 150–400)
RBC: 5.27 MIL/uL (ref 4.22–5.81)
RDW: 12.6 % (ref 11.5–15.5)
WBC: 13.9 10*3/uL — ABNORMAL HIGH (ref 4.0–10.5)
nRBC: 0 % (ref 0.0–0.2)

## 2022-08-10 SURGERY — EXCISION, GANGLION CYST, WRIST
Anesthesia: Monitor Anesthesia Care | Site: Wrist | Laterality: Left

## 2022-08-10 MED ORDER — BUPIVACAINE HCL (PF) 0.25 % IJ SOLN
INTRAMUSCULAR | Status: AC
  Filled 2022-08-10: qty 20

## 2022-08-10 MED ORDER — CEFAZOLIN IN SODIUM CHLORIDE 3-0.9 GM/100ML-% IV SOLN
3.0000 g | INTRAVENOUS | Status: AC
  Administered 2022-08-10: 3 g via INTRAVENOUS

## 2022-08-10 MED ORDER — ROPIVACAINE HCL 7.5 MG/ML IJ SOLN: INTRAMUSCULAR | Status: DC | PRN

## 2022-08-10 MED ORDER — MIDAZOLAM HCL 2 MG/2ML IJ SOLN
INTRAMUSCULAR | Status: DC | PRN
  Administered 2022-08-10: 2 mg via INTRAVENOUS

## 2022-08-10 MED ORDER — CHLORHEXIDINE GLUCONATE 0.12 % MT SOLN: 15.0000 mL | Freq: Once | OROMUCOSAL | Status: AC

## 2022-08-10 MED ORDER — FENTANYL CITRATE PF 50 MCG/ML IJ SOSY
50.0000 ug | PREFILLED_SYRINGE | Freq: Once | INTRAMUSCULAR | Status: AC
  Filled 2022-08-10: qty 1

## 2022-08-10 MED ORDER — PROPOFOL 500 MG/50ML IV EMUL
INTRAVENOUS | Status: DC | PRN
  Administered 2022-08-10: 75 ug/kg/min via INTRAVENOUS

## 2022-08-10 MED ORDER — LACTATED RINGERS IV SOLN: INTRAVENOUS | Status: DC

## 2022-08-10 MED ORDER — ROPIVACAINE HCL 5 MG/ML IJ SOLN
INTRAMUSCULAR | Status: DC | PRN
  Administered 2022-08-10: 30 mL via PERINEURAL

## 2022-08-10 MED ORDER — LIDOCAINE HCL 1 % IJ SOLN
INTRAMUSCULAR | Status: AC
  Filled 2022-08-10: qty 20

## 2022-08-10 MED ORDER — ORAL CARE MOUTH RINSE: 15.0000 mL | Freq: Once | OROMUCOSAL | Status: AC

## 2022-08-10 MED ORDER — MIDAZOLAM HCL 2 MG/2ML IJ SOLN
2.0000 mg | Freq: Once | INTRAMUSCULAR | Status: AC
  Filled 2022-08-10: qty 2

## 2022-08-10 MED ORDER — OXYCODONE HCL 5 MG PO TABS
5.0000 mg | ORAL_TABLET | Freq: Four times a day (QID) | ORAL | 0 refills | Status: AC | PRN
Start: 2022-08-10 — End: 2022-08-15

## 2022-08-10 MED ORDER — CHLORHEXIDINE GLUCONATE 0.12 % MT SOLN
OROMUCOSAL | Status: AC
  Administered 2022-08-10: 15 mL via OROMUCOSAL
  Filled 2022-08-10: qty 15

## 2022-08-10 MED ORDER — AMISULPRIDE (ANTIEMETIC) 5 MG/2ML IV SOLN: 10.0000 mg | Freq: Once | INTRAVENOUS | Status: DC | PRN

## 2022-08-10 MED ORDER — BUPIVACAINE HCL (PF) 0.25 % IJ SOLN: INTRAMUSCULAR | Status: DC | PRN

## 2022-08-10 MED ORDER — ONDANSETRON HCL 4 MG/2ML IJ SOLN: 4.0000 mg | Freq: Once | INTRAMUSCULAR | Status: DC | PRN

## 2022-08-10 MED ORDER — LIDOCAINE 2% (20 MG/ML) 5 ML SYRINGE
INTRAMUSCULAR | Status: DC | PRN
  Administered 2022-08-10: 60 mg via INTRAVENOUS

## 2022-08-10 MED ORDER — FENTANYL CITRATE (PF) 100 MCG/2ML IJ SOLN: 25.0000 ug | INTRAMUSCULAR | Status: DC | PRN

## 2022-08-10 MED ORDER — PROPOFOL 10 MG/ML IV BOLUS
INTRAVENOUS | Status: DC | PRN
  Administered 2022-08-10: 40 mg via INTRAVENOUS

## 2022-08-10 MED ORDER — CEFAZOLIN IN SODIUM CHLORIDE 3-0.9 GM/100ML-% IV SOLN
INTRAVENOUS | Status: AC
  Filled 2022-08-10: qty 100

## 2022-08-10 MED ORDER — LIDOCAINE HCL 1 % IJ SOLN: INTRAMUSCULAR | Status: DC | PRN

## 2022-08-10 MED ORDER — MIDAZOLAM HCL 2 MG/2ML IJ SOLN
INTRAMUSCULAR | Status: AC
  Filled 2022-08-10: qty 2

## 2022-08-10 MED ORDER — FENTANYL CITRATE (PF) 100 MCG/2ML IJ SOLN
INTRAMUSCULAR | Status: AC
  Administered 2022-08-10: 50 ug
  Filled 2022-08-10: qty 2

## 2022-08-10 MED ORDER — DEXAMETHASONE SODIUM PHOSPHATE 10 MG/ML IJ SOLN
INTRAMUSCULAR | Status: DC | PRN
Start: 2022-08-10 — End: 2022-08-10
  Administered 2022-08-10: 10 mg

## 2022-08-10 MED ORDER — MIDAZOLAM HCL 2 MG/2ML IJ SOLN
INTRAMUSCULAR | Status: AC
  Administered 2022-08-10: 2 mg via INTRAVENOUS
  Filled 2022-08-10: qty 2

## 2022-08-10 SURGICAL SUPPLY — 35 items
APL PRP STRL LF DISP 70% ISPRP (MISCELLANEOUS) ×1
BLADE SURG 15 STRL LF DISP TIS (BLADE) ×2 IMPLANT
BLADE SURG 15 STRL SS (BLADE) ×1
BNDG CMPR 5X3 KNIT ELC UNQ LF (GAUZE/BANDAGES/DRESSINGS) ×1
BNDG CMPR 9X4 STRL LF SNTH (GAUZE/BANDAGES/DRESSINGS) ×1
BNDG ELASTIC 3INX 5YD STR LF (GAUZE/BANDAGES/DRESSINGS) ×2 IMPLANT
BNDG ESMARK 4X9 LF (GAUZE/BANDAGES/DRESSINGS) ×2 IMPLANT
BNDG GAUZE DERMACEA FLUFF 4 (GAUZE/BANDAGES/DRESSINGS) ×2 IMPLANT
BNDG GZE DERMACEA 4 6PLY (GAUZE/BANDAGES/DRESSINGS) ×1
CHLORAPREP W/TINT 26 (MISCELLANEOUS) ×2 IMPLANT
CORD BIPOLAR FORCEPS 12FT (ELECTRODE) ×2 IMPLANT
COVER BACK TABLE 60X90IN (DRAPES) ×2 IMPLANT
DRAPE EXTREMITY T 121X128X90 (DISPOSABLE) ×2 IMPLANT
DRAPE SURG 17X23 STRL (DRAPES) ×2 IMPLANT
DRSG TUBE GAUZE 1X5YD SZ2 (GAUZE/BANDAGES/DRESSINGS) IMPLANT
DRSG XEROFORM 1X8 (GAUZE/BANDAGES/DRESSINGS) IMPLANT
GAUZE SPONGE 4X4 12PLY STRL (GAUZE/BANDAGES/DRESSINGS) IMPLANT
GAUZE XEROFORM 1X8 LF (GAUZE/BANDAGES/DRESSINGS) ×2 IMPLANT
GLOVE BIO SURGEON STRL SZ7 (GLOVE) ×2 IMPLANT
GLOVE BIOGEL PI IND STRL 7.0 (GLOVE) ×2 IMPLANT
GOWN STRL REUS W/ TWL LRG LVL3 (GOWN DISPOSABLE) ×4 IMPLANT
GOWN STRL REUS W/TWL LRG LVL3 (GOWN DISPOSABLE) ×2
NDL HYPO 25X1 1.5 SAFETY (NEEDLE) IMPLANT
NEEDLE HYPO 25X1 1.5 SAFETY (NEEDLE) ×1 IMPLANT
NS IRRIG 1000ML POUR BTL (IV SOLUTION) ×2 IMPLANT
PACK BASIN DAY SURGERY FS (CUSTOM PROCEDURE TRAY) ×2 IMPLANT
PADDING CAST ABS COTTON 4X4 ST (CAST SUPPLIES) IMPLANT
SHEET MEDIUM DRAPE 40X70 STRL (DRAPES) ×2 IMPLANT
SPLINT FIBERGLASS 4X30 (CAST SUPPLIES) IMPLANT
SUT ETHILON 4 0 PS 2 18 (SUTURE) ×2 IMPLANT
SUT MNCRL AB 3-0 PS2 18 (SUTURE) ×2 IMPLANT
SYR BULB EAR ULCER 3OZ GRN STR (SYRINGE) ×2 IMPLANT
SYR CONTROL 10ML LL (SYRINGE) IMPLANT
TOWEL GREEN STERILE FF (TOWEL DISPOSABLE) ×4 IMPLANT
UNDERPAD 30X36 HEAVY ABSORB (UNDERPADS AND DIAPERS) ×2 IMPLANT

## 2022-08-10 NOTE — Brief Op Note (Signed)
08/10/2022  9:08 PM  PATIENT:  Glenn Dorsey  37 y.o. male  PRE-OPERATIVE DIAGNOSIS:  Left volar ganglion cyst  POST-OPERATIVE DIAGNOSIS:  Left volar ganglion cyst  PROCEDURE:  Procedure(s) with comments: Excision of left volar carpal ganglion cyst (Left) - regional 45  SURGEON:  Surgeons and Role:    * Marlyne Beards, MD - Primary  PHYSICIAN ASSISTANT:   ASSISTANTS: none   ANESTHESIA:   regional  EBL:  5 mL   BLOOD ADMINISTERED:none  DRAINS: none   LOCAL MEDICATIONS USED:  NONE  SPECIMEN:  Source of Specimen:  Left wrist for pathology  DISPOSITION OF SPECIMEN:  PATHOLOGY  COUNTS:  YES  TOURNIQUET:  * Missing tourniquet times found for documented tourniquets in log: [id number] *  DICTATION: .Dragon Dictation  PLAN OF CARE: Discharge to home after PACU  PATIENT DISPOSITION:  PACU - hemodynamically stable.   Delay start of Pharmacological VTE agent (>24hrs) due to surgical blood loss or risk of bleeding: not applicable

## 2022-08-10 NOTE — Interval H&P Note (Signed)
History and Physical Interval Note:  08/10/2022 4:08 PM  Glenn Dorsey  has presented today for surgery, with the diagnosis of Left volar carpal ganglion cyst.  The various methods of treatment have been discussed with the patient and family. After consideration of risks, benefits and other options for treatment, the patient has consented to  Procedure(s) with comments: Excision of left volar carpal ganglion cyst (Left) - regional 45 as a surgical intervention.  The patient's history has been reviewed, patient examined, no change in status, stable for surgery.  I have reviewed the patient's chart and labs.  Questions were answered to the patient's satisfaction.     [name]

## 2022-08-10 NOTE — Anesthesia Procedure Notes (Signed)
Procedure Name: MAC Date/Time: 08/10/2022 7:53 PM  Performed by: Aundria Rud, CRNAPre-anesthesia Checklist: Patient identified, Emergency Drugs available, Suction available and Patient being monitored Patient Re-evaluated:Patient Re-evaluated prior to induction Oxygen Delivery Method: Simple face mask Preoxygenation: Pre-oxygenation with 100% oxygen Induction Type: IV induction Placement Confirmation: positive ETCO2 and CO2 detector Dental Injury: Teeth and Oropharynx as per pre-operative assessment

## 2022-08-10 NOTE — Transfer of Care (Signed)
Immediate Anesthesia Transfer of Care Note  Patient: Glenn Dorsey  Procedure(s) Performed: Excision of left volar carpal ganglion cyst (Left: Wrist)  Patient Location: PACU  Anesthesia Type:MAC  Level of Consciousness: alert , oriented, and patient cooperative  Airway & Oxygen Therapy: Patient Spontanous Breathing  Post-op Assessment: Report given to RN and Post -op Vital signs reviewed and stable  Post vital signs: Reviewed and stable  Last Vitals:  Vitals Value Taken Time  BP    Temp    Pulse 109 08/10/22 2119  Resp 23 08/10/22 2119  SpO2 95 % 08/10/22 2119  Vitals shown include unfiled device data.  Last Pain:  Vitals:   08/10/22 1905  PainSc: 0-No pain         Complications: No notable events documented.

## 2022-08-10 NOTE — H&P (Signed)
HAND SURGERY   HPI: Patient is a 37 y.o. male who presents with a left volar radial wrist mass.  He was previously seen by another physician who reportedly aspirated the mass with return of clear, mucinous fluid.  They mass rapidly returned.  It is becoming more bothersome.  Patient denies any changes to their medical history or new systemic symptoms today.    Past Medical History:  Diagnosis Date   Asthma    not since early 20's   Pre-diabetes    Past Surgical History:  Procedure Laterality Date   NO PAST SURGERIES     Social History   Socioeconomic History   Marital status: Single    Spouse name: Not on file   Number of children: Not on file   Years of education: Not on file   Highest education level: Not on file  Occupational History   Not on file  Tobacco Use   Smoking status: Former    Types: Cigarettes   Smokeless tobacco: Not on file  Vaping Use   Vaping status: Never Used  Substance and Sexual Activity   Alcohol use: No   Drug use: No   Sexual activity: Not on file  Other Topics Concern   Not on file  Social History Narrative   Not on file   Social Determinants of Health   Financial Resource Strain: Not on file  Food Insecurity: Not on file  Transportation Needs: Not on file  Physical Activity: Not on file  Stress: Not on file  Social Connections: Not on file   Family History  Problem Relation Age of Onset   Diabetes Mother    Diabetes Father    - negative except otherwise stated in the family history section Allergies  Allergen Reactions   Peanut Butter Flavor Anaphylaxis   Prior to Admission medications   Medication Sig Start Date End Date Taking? Authorizing Provider  albuterol (VENTOLIN HFA) 108 (90 Base) MCG/ACT inhaler Inhale 1-2 puffs into the lungs every 4 (four) hours as needed for wheezing or shortness of breath. 06/28/22  Yes [provider]  cetirizine (ZYRTEC) 10 MG tablet Take 10 mg by mouth at bedtime.   Yes [provider]  EPINEPHrine 0.3 mg/0.3 mL IJ SOAJ injection Inject 0.3 mg into the muscle as needed for anaphylaxis. 11/17/21  Yes [provider]   No results found. - Positive ROS: All other systems have been reviewed and were otherwise negative with the exception of those mentioned in the HPI and as above.  Physical Exam: General: No acute distress, resting comfortably Cardiovascular: BUE warm and well perfused, normal rate Respiratory: Normal WOB on RA Skin: Warm and dry Neurologic: Sensation intact distally Psychiatric: Patient is at baseline mood and affect  Left Upper Extremity  Mass at the volar radial aspect of the wrist that is firm, round, mobile, and subcutaneous.  The radial artery is palpable radial to the mass.  He has full and painless AROM of the wrist and fingers.  SILT m/u/r distribution.  His hand is warm and well perfused w/ BCR.   Assessment: 37 yo M w/ volar radial right wrist mass.  This seems most consistent with a volar carpal ganglion cyst.   Plan: OR today for mass excision. We again reviewed the risks of surgery which include bleeding, infection, damage to neurovascular structures, persistent symptoms, mass recurrence, need for additional surgery.  Informed consent was signed.  All questions were answered.   Marlyne Beards, M.D. [facility] 4:05  PM

## 2022-08-10 NOTE — Anesthesia Procedure Notes (Signed)
Anesthesia Regional Block: Supraclavicular block   Pre-Anesthetic Checklist: , timeout performed,  Correct Patient, Correct Site, Correct Laterality,  Correct Procedure, Correct Position, site marked,  Risks and benefits discussed,  Surgical consent,  Pre-op evaluation,  At surgeon's request and post-op pain management  Laterality: Left  Prep: chloraprep       Needles:  Injection technique: Single-shot  Needle Type: Echogenic Needle     Needle Length: 9cm  Needle Gauge: 21     Additional Needles:   Procedures:,,,, ultrasound used (permanent image in chart),,    Narrative:  Start time: 08/10/2022 4:45 PM End time: 08/10/2022 4:51 PM Injection made incrementally with aspirations every 5 mL.  Performed by: Personally  Anesthesiologist: Collene Schlichter, MD  Additional Notes: No pain on injection. No increased resistance to injection. Injection made in 5cc increments.  Good needle visualization.  Patient tolerated procedure well.

## 2022-08-10 NOTE — Op Note (Addendum)
   Date of Surgery: 08/10/2022  INDICATIONS: Patient is a 37 y.o.-year-old male with a left volar radial mass.  The mass was reportedly aspirated by a primary care sports medicine physician with clear, mucinous fluid.  The mass has returned and has continued to enlarge.  It is becoming increasingly uncomfortable.  Risks, benefits, and alternatives to surgery were again discussed with the patient in the preoperative area. The patient wishes to proceed with surgery.  Informed consent was signed after our discussion.   PREOPERATIVE DIAGNOSIS:  Left volar carpal ganglion cyst  POSTOPERATIVE DIAGNOSIS: Same.  PROCEDURE:  Left volar carpal ganglion cyst excision   SURGEON: Waylan Rocher, M.D.  ASSIST:   ANESTHESIA:  Regional, MAC  IV FLUIDS AND URINE: See anesthesia.  ESTIMATED BLOOD LOSS: <5 mL.  IMPLANTS: * No implants in log *   DRAINS: None  COMPLICATIONS: None  SPECIMEN: Hemorrhagic, lobulated mass for permanent pathology  DESCRIPTION OF PROCEDURE: The patient was met in the preoperative holding area where the surgical site was marked and the consent form was signed.  A regional block was performed by anesthesia. The patient was then taken to the operating room and transferred to the operating table.  All bony prominences were well padded.  A tourniquet was applied to the left upper arm.  Monitored anesthesia was induced.  The operative extremity was prepped and draped in the usual and sterile fashion.  A formal time-out was performed to confirm that this was the correct patient, surgery, side, and site.   Following formal timeout, the limb was gently exsanguinated with an Esmarch bandage.  A longitudinal incision was made at the volar radial aspect of the wrist centered over the mass.  The skin was incised.  Blunt dissection was used to identify the mass which was deep to the subcutaneous tissue.  It was apparent that the radial artery and 2 accompanying veins were adherent to the  mass.  The mass was lobulated, well-circumscribed, and hemorrhagic appearing.  The radial artery and accompanying veins were dissected both proximally and distally.  The mass was dissected circumferentially using a combination of blunt dissection and bipolar electrocautery.  With great care, the radial artery was dissected off of the mass using blunt dissection and bipolar cautery.  The 2  accompanying veins were also dissected off of the mass in a similar fashion.  The tail of the mass was identified traveling deep towards the carpus.  The tail was identified and excised using a combination of scissor and bipolar cautery.  The specimen was passed off the back table en bloc for permanent pathology.  The tourniquet was then deflated.  The radial artery was pulsating normally without evidence of injury.  The wound was hemostatic.  The wound was thoroughly irrigated with copious sterile saline.  It was closed using a 3-0 Monocryl suture in buried interrupted fashion followed by a 4-0 nylon suture in horizontal mattress fashion.  Wound was then dressed with Xeroform, folded Kerlix, and an Ace wrap.  The patient was reversed from anesthesia.  They were transferred from the operating table to the postoperative bed.  All counts were correct x 2 at the end of the procedure.  The patient was then taken to the PACU in stable condition.   POSTOPERATIVE PLAN: He will be discharged home with appropriate pain medication and discharge instructions.  I will see him back in 10 to 14 days for his first postop visit.  Waylan Rocher, MD 9:16 PM

## 2022-08-10 NOTE — Anesthesia Preprocedure Evaluation (Signed)
Anesthesia Evaluation  Patient identified by MRN, date of birth, ID band Patient awake    Reviewed: Allergy & Precautions, NPO status , Patient's Chart, lab work & pertinent test results  Airway Mallampati: II  TM Distance: >3 FB Neck ROM: Full    Dental  (+) Teeth Intact, Dental Advisory Given   Pulmonary asthma , Patient abstained from smoking., former smoker   Pulmonary exam normal breath sounds clear to auscultation       Cardiovascular negative cardio ROS Normal cardiovascular exam Rhythm:Regular Rate:Normal     Neuro/Psych negative neurological ROS     GI/Hepatic negative GI ROS, Neg liver ROS,,,  Endo/Other    Morbid obesity (BMI 51)  Renal/GU negative Renal ROS     Musculoskeletal  Left volar carpal ganglion cyst   Abdominal   Peds  Hematology negative hematology ROS (+)   Anesthesia Other Findings Day of surgery medications reviewed with the patient.  Reproductive/Obstetrics                              Anesthesia Physical Anesthesia Plan  ASA: 4  Anesthesia Plan: MAC and Regional   Post-op Pain Management: Regional block* and Ofirmev IV (intra-op)*   Induction: Intravenous  PONV Risk Score and Plan: 1 and TIVA, Midazolam, Treatment may vary due to age or medical condition, Dexamethasone and Ondansetron  Airway Management Planned: Natural Airway and Simple Face Mask  Additional Equipment:   Intra-op Plan:   Post-operative Plan:   Informed Consent: I have reviewed the patients History and Physical, chart, labs and discussed the procedure including the risks, benefits and alternatives for the proposed anesthesia with the patient or authorized representative who has indicated his/her understanding and acceptance.     Dental advisory given  Plan Discussed with: CRNA and Anesthesiologist  Anesthesia Plan Comments:          Anesthesia Quick Evaluation

## 2022-08-11 ENCOUNTER — Encounter (HOSPITAL_COMMUNITY): Payer: Self-pay | Admitting: Orthopedic Surgery

## 2022-08-12 ENCOUNTER — Encounter (HOSPITAL_COMMUNITY): Payer: Self-pay | Admitting: Orthopedic Surgery

## 2022-08-12 NOTE — Anesthesia Postprocedure Evaluation (Signed)
Anesthesia Post Note  Patient: Glenn Dorsey  Procedure(s) Performed: Excision of left volar carpal ganglion cyst (Left: Wrist)     Patient location during evaluation: PACU Anesthesia Type: Regional and MAC Level of consciousness: awake and alert Pain management: pain level controlled Vital Signs Assessment: post-procedure vital signs reviewed and stable Respiratory status: spontaneous breathing, respiratory function stable and nonlabored ventilation Cardiovascular status: stable and blood pressure returned to baseline Postop Assessment: no apparent nausea or vomiting Anesthetic complications: no   No notable events documented.  Last Vitals:  Vitals:   08/10/22 2145 08/10/22 2150  BP: (!) 152/99   Pulse: 89 91  Resp: 17 16  Temp:  36.7 C  SpO2: 97% 96%    Last Pain:  Vitals:   08/10/22 2145  PainSc: 0-No pain                 Valine Drozdowski

## 2023-05-09 ENCOUNTER — Emergency Department (HOSPITAL_COMMUNITY)

## 2023-05-09 ENCOUNTER — Encounter (HOSPITAL_COMMUNITY): Payer: Self-pay

## 2023-05-09 ENCOUNTER — Inpatient Hospital Stay (HOSPITAL_COMMUNITY)
Admission: EM | Admit: 2023-05-09 | Discharge: 2023-05-19 | DRG: 872 | Disposition: A | Discharge status: Home / Self Care | Attending: Internal Medicine | Admitting: Internal Medicine

## 2023-05-09 ENCOUNTER — Other Ambulatory Visit: Payer: Self-pay

## 2023-05-09 DIAGNOSIS — J45909 Unspecified asthma, uncomplicated: Secondary | ICD-10-CM | POA: Diagnosis present

## 2023-05-09 DIAGNOSIS — Z87891 Personal history of nicotine dependence: Secondary | ICD-10-CM | POA: Diagnosis not present

## 2023-05-09 DIAGNOSIS — Z6841 Body Mass Index (BMI) 40.0 and over, adult: Secondary | ICD-10-CM

## 2023-05-09 DIAGNOSIS — E876 Hypokalemia: Secondary | ICD-10-CM | POA: Diagnosis present

## 2023-05-09 DIAGNOSIS — Z91018 Allergy to other foods: Secondary | ICD-10-CM

## 2023-05-09 DIAGNOSIS — A419 Sepsis, unspecified organism: Secondary | ICD-10-CM | POA: Diagnosis present

## 2023-05-09 DIAGNOSIS — D75838 Other thrombocytosis: Secondary | ICD-10-CM | POA: Diagnosis not present

## 2023-05-09 DIAGNOSIS — K5792 Diverticulitis of intestine, part unspecified, without perforation or abscess without bleeding: Principal | ICD-10-CM

## 2023-05-09 DIAGNOSIS — R739 Hyperglycemia, unspecified: Secondary | ICD-10-CM | POA: Diagnosis not present

## 2023-05-09 DIAGNOSIS — E66813 Obesity, class 3: Secondary | ICD-10-CM | POA: Insufficient documentation

## 2023-05-09 DIAGNOSIS — K5720 Diverticulitis of large intestine with perforation and abscess without bleeding: Secondary | ICD-10-CM | POA: Diagnosis present

## 2023-05-09 DIAGNOSIS — Z87892 Personal history of anaphylaxis: Secondary | ICD-10-CM | POA: Diagnosis not present

## 2023-05-09 DIAGNOSIS — Z833 Family history of diabetes mellitus: Secondary | ICD-10-CM

## 2023-05-09 DIAGNOSIS — Z79891 Long term (current) use of opiate analgesic: Secondary | ICD-10-CM | POA: Diagnosis not present

## 2023-05-09 DIAGNOSIS — I10 Essential (primary) hypertension: Secondary | ICD-10-CM | POA: Diagnosis present

## 2023-05-09 DIAGNOSIS — Z1152 Encounter for screening for COVID-19: Secondary | ICD-10-CM | POA: Diagnosis not present

## 2023-05-09 LAB — CBC WITH DIFFERENTIAL/PLATELET
Abs Immature Granulocytes: 0.1 10*3/uL — ABNORMAL HIGH (ref 0.00–0.07)
Basophils Absolute: 0.1 10*3/uL (ref 0.0–0.1)
Basophils Relative: 1 %
Eosinophils Absolute: 0.2 10*3/uL (ref 0.0–0.5)
Eosinophils Relative: 1 %
HCT: 44.4 % (ref 39.0–52.0)
Hemoglobin: 13.9 g/dL (ref 13.0–17.0)
Immature Granulocytes: 1 %
Lymphocytes Relative: 11 %
Lymphs Abs: 2.2 10*3/uL (ref 0.7–4.0)
MCH: 26.1 pg (ref 26.0–34.0)
MCHC: 31.3 g/dL (ref 30.0–36.0)
MCV: 83.5 fL (ref 80.0–100.0)
Monocytes Absolute: 1.2 10*3/uL — ABNORMAL HIGH (ref 0.1–1.0)
Monocytes Relative: 6 %
Neutro Abs: 15.6 10*3/uL — ABNORMAL HIGH (ref 1.7–7.7)
Neutrophils Relative %: 80 %
Platelets: 340 10*3/uL (ref 150–400)
RBC: 5.32 MIL/uL (ref 4.22–5.81)
RDW: 12.8 % (ref 11.5–15.5)
WBC: 19.4 10*3/uL — ABNORMAL HIGH (ref 4.0–10.5)
nRBC: 0 % (ref 0.0–0.2)

## 2023-05-09 LAB — RESP PANEL BY RT-PCR (RSV, FLU A&B, COVID)  RVPGX2
Influenza A by PCR: NEGATIVE
Influenza B by PCR: NEGATIVE
Resp Syncytial Virus by PCR: NEGATIVE
SARS Coronavirus 2 by RT PCR: NEGATIVE

## 2023-05-09 LAB — I-STAT CG4 LACTIC ACID, ED
Lactic Acid, Venous: 1.1 mmol/L (ref 0.5–1.9)
Lactic Acid, Venous: 1.2 mmol/L (ref 0.5–1.9)

## 2023-05-09 LAB — URINALYSIS, W/ REFLEX TO CULTURE (INFECTION SUSPECTED)
Bacteria, UA: NONE SEEN
Bilirubin Urine: NEGATIVE
Glucose, UA: NEGATIVE mg/dL
Ketones, ur: NEGATIVE mg/dL
Leukocytes,Ua: NEGATIVE
Nitrite: NEGATIVE
Protein, ur: 30 mg/dL — AB
Specific Gravity, Urine: 1.019 (ref 1.005–1.030)
pH: 5 (ref 5.0–8.0)

## 2023-05-09 LAB — COMPREHENSIVE METABOLIC PANEL WITH GFR
ALT: 83 U/L — ABNORMAL HIGH (ref 0–44)
AST: 28 U/L (ref 15–41)
Albumin: 3.7 g/dL (ref 3.5–5.0)
Alkaline Phosphatase: 100 U/L (ref 38–126)
Anion gap: 12 (ref 5–15)
BUN: 7 mg/dL (ref 6–20)
CO2: 27 mmol/L (ref 22–32)
Calcium: 9.1 mg/dL (ref 8.9–10.3)
Chloride: 96 mmol/L — ABNORMAL LOW (ref 98–111)
Creatinine, Ser: 0.52 mg/dL — ABNORMAL LOW (ref 0.61–1.24)
GFR, Estimated: >60 mL/min (ref >60)
Glucose, Bld: 117 mg/dL — ABNORMAL HIGH (ref 70–99)
Potassium: 3.1 mmol/L — ABNORMAL LOW (ref 3.5–5.1)
Sodium: 135 mmol/L (ref 135–145)
Total Bilirubin: 1.1 mg/dL (ref 0.0–1.2)
Total Protein: 8.3 g/dL — ABNORMAL HIGH (ref 6.5–8.1)

## 2023-05-09 MED ORDER — SODIUM CHLORIDE 0.9 % IV SOLN
2.0000 g | Freq: Once | INTRAVENOUS | Status: DC
Start: 2023-05-09 — End: 2023-05-09

## 2023-05-09 MED ORDER — ONDANSETRON HCL 4 MG PO TABS: 4.0000 mg | ORAL_TABLET | Freq: Four times a day (QID) | ORAL | Status: DC | PRN

## 2023-05-09 MED ORDER — SODIUM CHLORIDE 0.9 % IV SOLN
2.0000 g | INTRAVENOUS | Status: DC
  Administered 2023-05-09 – 2023-05-18 (×10): 2 g via INTRAVENOUS
  Filled 2023-05-09 (×10): qty 20

## 2023-05-09 MED ORDER — ONDANSETRON HCL 4 MG/2ML IJ SOLN
4.0000 mg | Freq: Four times a day (QID) | INTRAMUSCULAR | Status: DC | PRN
Start: 2023-05-09 — End: 2023-05-19
  Administered 2023-05-10 – 2023-05-11 (×2): 4 mg via INTRAVENOUS
  Filled 2023-05-09 (×2): qty 2

## 2023-05-09 MED ORDER — IOHEXOL 300 MG/ML  SOLN
100.0000 mL | Freq: Once | INTRAMUSCULAR | Status: AC | PRN
  Administered 2023-05-09: 100 mL via INTRAVENOUS

## 2023-05-09 MED ORDER — LABETALOL HCL 5 MG/ML IV SOLN
10.0000 mg | Freq: Once | INTRAVENOUS | Status: AC
  Administered 2023-05-09: 10 mg via INTRAVENOUS
  Filled 2023-05-09: qty 4

## 2023-05-09 MED ORDER — ACETAMINOPHEN 500 MG PO TABS: 1000.0000 mg | ORAL_TABLET | Freq: Three times a day (TID) | ORAL | Status: DC | PRN

## 2023-05-09 MED ORDER — HYDROCODONE-ACETAMINOPHEN 5-325 MG PO TABS
1.0000 | ORAL_TABLET | ORAL | Status: DC | PRN
  Administered 2023-05-10 (×3): 2 via ORAL
  Filled 2023-05-09 (×3): qty 2

## 2023-05-09 MED ORDER — MORPHINE SULFATE (PF) 2 MG/ML IV SOLN
2.0000 mg | INTRAVENOUS | Status: AC | PRN
  Administered 2023-05-10 – 2023-05-12 (×4): 2 mg via INTRAVENOUS
  Filled 2023-05-09 (×4): qty 1

## 2023-05-09 MED ORDER — LACTATED RINGERS IV BOLUS
1000.0000 mL | Freq: Once | INTRAVENOUS | Status: AC
  Administered 2023-05-09: 1000 mL via INTRAVENOUS

## 2023-05-09 MED ORDER — POTASSIUM CHLORIDE 10 MEQ/100ML IV SOLN
10.0000 meq | INTRAVENOUS | Status: AC
  Administered 2023-05-10 (×4): 10 meq via INTRAVENOUS
  Filled 2023-05-09 (×4): qty 100

## 2023-05-09 MED ORDER — METRONIDAZOLE 500 MG/100ML IV SOLN
500.0000 mg | Freq: Two times a day (BID) | INTRAVENOUS | Status: DC
  Administered 2023-05-09 – 2023-05-18 (×19): 500 mg via INTRAVENOUS
  Filled 2023-05-09 (×19): qty 100

## 2023-05-09 MED ORDER — ACETAMINOPHEN 650 MG RE SUPP: 650.0000 mg | Freq: Four times a day (QID) | RECTAL | Status: DC | PRN

## 2023-05-09 MED ORDER — ACETAMINOPHEN 500 MG PO TABS
1000.0000 mg | ORAL_TABLET | Freq: Once | ORAL | Status: AC
  Administered 2023-05-09: 1000 mg via ORAL
  Filled 2023-05-09: qty 2

## 2023-05-09 MED ORDER — LABETALOL HCL 5 MG/ML IV SOLN
10.0000 mg | INTRAVENOUS | Status: DC | PRN
  Administered 2023-05-09 – 2023-05-12 (×7): 10 mg via INTRAVENOUS
  Filled 2023-05-09 (×7): qty 4

## 2023-05-09 MED ORDER — BISACODYL 5 MG PO TBEC
5.0000 mg | DELAYED_RELEASE_TABLET | Freq: Every day | ORAL | Status: DC | PRN
  Administered 2023-05-12 – 2023-05-18 (×2): 5 mg via ORAL
  Filled 2023-05-09 (×2): qty 1

## 2023-05-09 MED ORDER — ACETAMINOPHEN 325 MG PO TABS: 650.0000 mg | ORAL_TABLET | Freq: Four times a day (QID) | ORAL | Status: DC | PRN

## 2023-05-09 MED ORDER — SENNOSIDES-DOCUSATE SODIUM 8.6-50 MG PO TABS
1.0000 | ORAL_TABLET | Freq: Every evening | ORAL | Status: DC | PRN
  Administered 2023-05-10 – 2023-05-11 (×2): 1 via ORAL
  Filled 2023-05-09 (×2): qty 1

## 2023-05-09 MED ORDER — SODIUM CHLORIDE 0.9% FLUSH
3.0000 mL | Freq: Two times a day (BID) | INTRAVENOUS | Status: DC
  Administered 2023-05-09 – 2023-05-19 (×15): 3 mL via INTRAVENOUS

## 2023-05-09 NOTE — H&P (Signed)
 History and Physical    Glenn Dorsey ZOX:[id number] DOB: [birth year] DOA: 05/09/2023  PCP: Bari Boos, FNP  Patient coming from: Home  I have personally briefly reviewed patient's old medical records in [facility]  Chief Complaint: Lower abdominal pain, fevers  HPI: Glenn Dorsey is a 38 y.o. male with medical history significant for obesity who presented to the ED for evaluation of lower abdominal pain.  Patient reports developing lower abdominal pain 2 days ago.  He has had associated fevers, chills, diaphoresis, and nausea without emesis.  He states that he has had decreased urine output with concentrated dark urine.  No burning or pain with urination.  He says his last bowel movement this morning was loose. He denies chest pain, dyspnea, cough.  Patient states that he does not take any medications regularly.  He denies any known chronic medical conditions.  ED Course  Labs/Imaging on admission: I have personally reviewed following labs and imaging studies.  Initial vitals showed BP 188/128, pulse 118, RR 20, temp 102.9 F, SpO2 99% on room air.  Labs show WBC 19.4, hemoglobin 13.9, platelets 340, sodium 135, potassium 3.1, bicarb 27, BUN 7, creatinine 0.52, serum glucose 117, AST 28, ALT 83, alk phos 100, total bilirubin 1.1, lactic acid 1.1 > 1.2.  UA negative for UTI.  Blood cultures in process.  SARS-CoV-2, influenza, RSV PCR negative.  CT abdomen/pelvis with contrast showed colonic diverticulosis, acute diverticulitis of the sigmoid colon with pericolonic stranding and abscess measuring 3.1 cm in diameter.  No evidence of bowel obstruction, no free air.  Patient was given 1 L LR, IV ceftriaxone and Flagyl, IV labetalol 10 mg.  EDP spoke with general surgery who recommended medical admission and their team will see in consultation in the morning.  The hospitalist service was consulted to admit.  Review of Systems: All systems reviewed and are negative except as  documented in history of present illness above.   Past Medical History:  Diagnosis Date   Asthma    not since early 20's   Pre-diabetes     Past Surgical History:  Procedure Laterality Date   GANGLION CYST EXCISION Left 08/10/2022   Procedure: Excision of left volar carpal ganglion cyst;  Surgeon: Marilyn Shropshire, MD;  Location: [facility] OR;  Service: Orthopedics;  Laterality: Left;  regional 45   NO PAST SURGERIES      Social History: Patient reports he is a former smoker for about 7 years and is tobacco free now.  He reports occasional alcohol use, none recently.  He denies recreational drug use.  Allergies  Allergen Reactions   Peanut Butter Flavoring Agent (Non-Screening) Anaphylaxis    Family History  Problem Relation Age of Onset   Diabetes Mother    Diabetes Father      Prior to Admission medications   Medication Sig Start Date End Date Taking? Authorizing Provider  oxyCODONE  (OXY IR/ROXICODONE ) 5 MG immediate release tablet Take 1 tablet every 4 hours by oral route. 08/29/22  Yes [provider]  albuterol (VENTOLIN HFA) 108 (90 Base) MCG/ACT inhaler Inhale 1-2 puffs into the lungs every 4 (four) hours as needed for wheezing or shortness of breath. 06/28/22   [provider]  cetirizine (ZYRTEC) 10 MG tablet Take 10 mg by mouth at bedtime.    [provider]  EPINEPHrine 0.3 mg/0.3 mL IJ SOAJ injection Inject 0.3 mg into the muscle as needed for anaphylaxis. 11/17/21   [provider]    Physical Exam:  Vitals:   05/09/23 2105 05/09/23 2130 05/09/23 2237 05/09/23 2300  BP: (!) 208/125 (!) 199/130 (!) 187/126 (!) 197/126  Pulse: (!) 103 (!) 104 92 95  Resp: 16  16   Temp: (!) 102 F (38.9 C)  (!) 100.9 F (38.3 C)   TempSrc: Oral  Oral   SpO2: 96% 95% 92% 97%  Weight:      Height:       Constitutional: Obese man resting in bed.  NAD, calm, comfortable Eyes: EOMI, lids and conjunctivae normal ENMT: Mucous membranes are moist.  Posterior pharynx clear of any exudate or lesions.Normal dentition.  Neck: normal, supple, no masses. Respiratory: clear to auscultation bilaterally, no wheezing, no crackles. Normal respiratory effort. No accessory muscle use.  Cardiovascular: Regular rate and rhythm, no murmurs / rubs / gallops. No extremity edema. 2+ pedal pulses. Abdomen: no tenderness, no masses palpated. Musculoskeletal: no clubbing / cyanosis. No joint deformity upper and lower extremities. Good ROM, no contractures. Normal muscle tone.  Skin: Diaphoretic.  No rashes, lesions, ulcers. No induration Neurologic: Sensation intact. Strength 5/5 in all 4.  Psychiatric: Normal judgment and insight. Alert and oriented x 3. Normal mood.   EKG: Personally reviewed. Sinus rhythm, rate 107, incomplete RBBB.  Assessment/Plan Principal Problem:   Sepsis (HCC) Active Problems:   Abscess of sigmoid colon due to diverticulitis   Hypokalemia   Glenn Dorsey is a 38 y.o. male with medical history significant for obesity who is admitted with sepsis due to acute sigmoid diverticulitis with associated abscess.  Assessment and Plan: Sepsis due to acute diverticulitis of sigmoid colon with abscess: Presenting with fevers, tachycardia, leukocytosis.  CT A/P shows acute sigmoid diverticulitis with pericolonic stranding and abscess measuring 3.1 cm in diameter.  No evidence of bowel obstruction or free air. - Continue IV ceftriaxone and Flagyl - Continue IV fluid hydration overnight - Follow blood cultures - Bowel rest tonight - General Surgery to consult in a.m.  Elevated blood pressure: BP significantly elevated on arrival.  Patient denies any known history of HTN but suspect he does have underlying hypertension.  BP may be increased due to pain.  Continue pain control and IV labetalol as needed.  Hypokalemia: IV supplement ordered.  Obesity: Body mass index is 50.04 kg/m.   DVT prophylaxis: SCDs Start: 05/09/23 2316 Code  Status: Full code Family Communication: Discussed with patient, he has discussed with family Disposition Plan: From home and likely discharge to home pending clinical progress Consults called: General Surgery Severity of Illness: The appropriate patient status for this patient is INPATIENT. Inpatient status is judged to be reasonable and necessary in order to provide the required intensity of service to ensure the patient's safety. The patient's presenting symptoms, physical exam findings, and initial radiographic and laboratory data in the context of their chronic comorbidities is felt to place them at high risk for further clinical deterioration. Furthermore, it is not anticipated that the patient will be medically stable for discharge from the hospital within 2 midnights of admission.   * I certify that at the point of admission it is my clinical judgment that the patient will require inpatient hospital care spanning beyond 2 midnights from the point of admission due to high intensity of service, high risk for further deterioration and high frequency of surveillance required.Edith Gores MD [facility]  If 7PM-7AM, please contact night-coverage [email or web address]  05/09/2023, 11:22 PM

## 2023-05-09 NOTE — ED Provider Notes (Signed)
 [place] EMERGENCY DEPARTMENT AT [facility] Provider Note   CSN: [id number] Arrival date & time: 05/09/23  1637     History  Chief Complaint  Patient presents with   Abdominal Pain    Bryan Lade is a 38 y.o. male.  This is a 38 year old gentleman with no self-reported medical history presenting with several days of fever, chills, and suprapubic abdominal pain.  He states over the weekend he began having some fevers and abdominal pain.  The pain is primarily suprapubic and associated with oliguria, dysuria, and sensation of incomplete voiding.  He otherwise denies chest pain, shortness of breath, cough, congestion, vomiting.  He did have 1 episode of loose stools this morning which were yellowish in color and endorses some ongoing nausea but he otherwise states he has been in his usual state of health.  He denies any back pain or flank pain.  Denies new sexual partner.  The patient states he has a doctor and sees them occasionally, last seen about a year ago.   Abdominal Pain Associated symptoms: chills, diarrhea, dysuria, fatigue, fever and nausea   Associated symptoms: no constipation, no cough, no hematuria, no shortness of breath and no vomiting        Home Medications Prior to Admission medications   Medication Sig Start Date End Date Taking? Authorizing Provider  oxyCODONE  (OXY IR/ROXICODONE ) 5 MG immediate release tablet Take 1 tablet every 4 hours by oral route. 08/29/22  Yes [provider]  albuterol (VENTOLIN HFA) 108 (90 Base) MCG/ACT inhaler Inhale 1-2 puffs into the lungs every 4 (four) hours as needed for wheezing or shortness of breath. 06/28/22   [provider]  cetirizine (ZYRTEC) 10 MG tablet Take 10 mg by mouth at bedtime.    [provider]  EPINEPHrine 0.3 mg/0.3 mL IJ SOAJ injection Inject 0.3 mg into the muscle as needed for anaphylaxis. 11/17/21   [provider]      Allergies    Peanut butter flavoring  agent (non-screening)    Review of Systems   Review of Systems  Constitutional:  Positive for chills, fatigue and fever.  Respiratory:  Negative for cough and shortness of breath.   Cardiovascular:  Positive for leg swelling.  Gastrointestinal:  Positive for abdominal pain, diarrhea and nausea. Negative for blood in stool, constipation and vomiting.  Genitourinary:  Positive for decreased urine volume, difficulty urinating and dysuria. Negative for flank pain and hematuria.       Denies new sexual partner, denies penile discharge.  Endorses dark urine without hematuria.  Musculoskeletal:  Negative for back pain.    Physical Exam Updated Vital Signs BP (!) 197/126   Pulse 95   Temp (!) 100.9 F (38.3 C) (Oral)   Resp 16   Ht 5\' 6"  (1.676 m)   Wt (!) 140.6 kg   SpO2 97%   BMI 50.04 kg/m  Physical Exam Constitutional:      General: He is not in acute distress.    Appearance: He is well-developed. He is diaphoretic. He is not ill-appearing or toxic-appearing.  Cardiovascular:     Rate and Rhythm: Regular rhythm. Tachycardia present.     Heart sounds: No murmur heard. Pulmonary:     Effort: Pulmonary effort is normal.     Breath sounds: Normal breath sounds.  Abdominal:     General: There is distension.     Tenderness: There is abdominal tenderness in the suprapubic area. There is no guarding or rebound.  Skin:  General: Skin is warm.  Neurological:     Mental Status: He is alert.     ED Results / Procedures / Treatments   Labs (all labs ordered are listed, but only abnormal results are displayed) Labs Reviewed  COMPREHENSIVE METABOLIC PANEL WITH GFR - Abnormal; Notable for the following components:      Result Value   Potassium 3.1 (*)    Chloride 96 (*)    Glucose, Bld 117 (*)    Creatinine, Ser 0.52 (*)    Total Protein 8.3 (*)    ALT 83 (*)    All other components within normal limits  CBC WITH DIFFERENTIAL/PLATELET - Abnormal; Notable for the following  components:   WBC 19.4 (*)    Neutro Abs 15.6 (*)    Monocytes Absolute 1.2 (*)    Abs Immature Granulocytes 0.10 (*)    All other components within normal limits  URINALYSIS, W/ REFLEX TO CULTURE (INFECTION SUSPECTED) - Abnormal; Notable for the following components:   Hgb urine dipstick SMALL (*)    Protein, ur 30 (*)    All other components within normal limits  CULTURE, BLOOD (ROUTINE X 2)  RESP PANEL BY RT-PCR (RSV, FLU A&B, COVID)  RVPGX2  CULTURE, BLOOD (ROUTINE X 2)  HIV ANTIBODY (ROUTINE TESTING W REFLEX)  MAGNESIUM  CBC  COMPREHENSIVE METABOLIC PANEL WITH GFR  I-STAT CG4 LACTIC ACID, ED  I-STAT CG4 LACTIC ACID, ED    EKG EKG Interpretation Date/Time:  Tuesday May 09 2023 17:08:34 EDT Ventricular Rate:  107 PR Interval:  142 QRS Duration:  117 QT Interval:  337 QTC Calculation: 450 R Axis:   95  Text Interpretation: Sinus tachycardia Incomplete right bundle branch block Probable anteroseptal infarct, old Confirmed by Onetha Bile [phone]) on 05/09/2023 5:41:29 PM  Radiology CT ABDOMEN PELVIS W CONTRAST Result Date: 05/09/2023 CLINICAL DATA:  Sepsis. Lower abdominal pain starting a few days ago. Nausea. Loose stools. Dysuria for 2 days. EXAM: CT ABDOMEN AND PELVIS WITH CONTRAST TECHNIQUE: Multidetector CT imaging of the abdomen and pelvis was performed using the standard protocol following bolus administration of intravenous contrast. RADIATION DOSE REDUCTION: This exam was performed according to the departmental dose-optimization program which includes automated exposure control, adjustment of the mA and/or kV according to patient size and/or use of iterative reconstruction technique. CONTRAST:  OMNIPAQUE IOHEXOL 300 MG/ML  SOLN COMPARISON:  Chest radiograph 05/09/2023 FINDINGS: Lower chest: Lung bases are clear. Hepatobiliary: Mild diffuse fatty infiltration of the liver. No focal liver lesions. Gallbladder and bile ducts are normal. Pancreas: Unremarkable. No  pancreatic ductal dilatation or surrounding inflammatory changes. Spleen: Normal in size without focal abnormality. Adrenals/Urinary Tract: Adrenal glands are unremarkable. Kidneys are normal, without renal calculi, focal lesion, or hydronephrosis. Bladder is unremarkable. Stomach/Bowel: Stomach, small bowel, and colon are mostly decompressed. Diverticulosis of the sigmoid colon. There is infiltration in the pelvic fat around the sigmoid colon. Within the sigmoid C-loop, there is an abscess with surrounding fat stranding. The abscess measures 3.1 cm diameter. Adjacent sigmoid colonic wall thickening. Changes are consistent with acute diverticulitis with pericolonic abscess. The appendix is normal. Vascular/Lymphatic: No significant vascular findings are present. No enlarged abdominal or pelvic lymph nodes. Reproductive: Prostate is unremarkable. Other: No free air in the abdomen. No free fluid. Abdominal wall musculature appears intact. Musculoskeletal: No acute or significant osseous findings. IMPRESSION: 1. Colonic diverticulosis. Acute diverticulitis of the sigmoid colon with pericolonic stranding and abscess measuring 3.1 cm diameter. 2. No evidence of bowel obstruction.  No free air. Electronically Signed   By: Boyce Byes M.D.   On: 05/09/2023 22:16   DG Chest Portable 1 View Result Date: 05/09/2023 CLINICAL DATA:  Lower abdominal pain.  Sepsis. EXAM: PORTABLE CHEST 1 VIEW COMPARISON:  February 21, 2013. FINDINGS: The heart size and mediastinal contours are within normal limits. Both lungs are clear. The visualized skeletal structures are unremarkable. IMPRESSION: No active disease. Electronically Signed   By: Rosalene Colon M.D.   On: 05/09/2023 17:56    Procedures Procedures    Medications Ordered in ED Medications  cefTRIAXone (ROCEPHIN) 2 g in sodium chloride  0.9 % 100 mL IVPB (0 g Intravenous Stopped 05/09/23 2057)  metroNIDAZOLE (FLAGYL) IVPB 500 mg (500 mg Intravenous New Bag/Given  05/09/23 2239)  sodium chloride  flush (NS) 0.9 % injection 3 mL (has no administration in time range)  acetaminophen  (TYLENOL ) tablet 650 mg (has no administration in time range)    Or  acetaminophen  (TYLENOL ) suppository 650 mg (has no administration in time range)  HYDROcodone -acetaminophen  (NORCO/VICODIN) 5-325 MG per tablet 1-2 tablet (has no administration in time range)  morphine (PF) 2 MG/ML injection 2 mg (has no administration in time range)  senna-docusate (Senokot-S) tablet 1 tablet (has no administration in time range)  bisacodyl (DULCOLAX) EC tablet 5 mg (has no administration in time range)  ondansetron  (ZOFRAN ) tablet 4 mg (has no administration in time range)    Or  ondansetron  (ZOFRAN ) injection 4 mg (has no administration in time range)  potassium chloride 10 mEq in 100 mL IVPB (has no administration in time range)  labetalol (NORMODYNE) injection 10 mg (has no administration in time range)  acetaminophen  (TYLENOL ) tablet 1,000 mg (1,000 mg Oral Given 05/09/23 2052)  iohexol (OMNIPAQUE) 300 MG/ML solution 100 mL (100 mLs Intravenous Contrast Given 05/09/23 2154)  labetalol (NORMODYNE) injection 10 mg (10 mg Intravenous Given 05/09/23 2232)  lactated ringers  bolus 1,000 mL (1,000 mLs Intravenous New Bag/Given 05/09/23 2237)    ED Course/ Medical Decision Making/ A&P Clinical Course as of 05/09/23 2325  Tue May 09, 2023  1731 Stable Abdominal pain/Suprapubic Septic VS [CC]    Clinical Course User Index [CC] Onetha Bile, MD                                 Medical Decision Making Amount and/or Complexity of Data Reviewed Labs: ordered. Radiology: ordered.  Risk OTC drugs. Prescription drug management. Decision regarding hospitalization.   Overall this is a 38 year old gentleman with no self-reported medical history presenting with multiday history of fever, chills, dysuria, oliguria, and suprapubic pain.  On arrival he is hypertensive to 188/128, febrile to  102.9, tachycardic to 118.  He is otherwise relatively well-appearing, exam showed suprapubic tenderness.  Would have concern for urinary tract infection, no back pain, flank pain, CVA tenderness to suggest pyelonephritis at this time.  Differential diagnosis would include appendicitis, enteritis, or colitis. Will obtain UA, CBC, CMP, lactic acid and bladder scan. Will initiate ceftriaxone, give Tylenol , obtain blood cultures, and respiratory panel.   Patient persistently hypertensive during his time in the ED.  In speaking to the patient, he states he has been told he has hypertension at some past visits.  I do see recent ED visit in May 2023 where he was documented blood pressure 181/113.  CMP demonstrating low potassium at 3.1.  ALT elevated to 83, lab work otherwise relatively unremarkable.  Kidney function well-maintained.  CBC with significant leukocytosis to 19.4.  Respiratory panel negative.  Lactic acid within normal limits.  UA showing proteinuria without any other acute abnormalities.  Chest x-ray without active disease.  Will obtain CT abdomen pelvis with contrast given the patient's presentation, negative UA.  CT abdomen pelvis demonstrating diverticulitis with abscess, will reach out to general surgery.  Surgery requesting medical admission, will consult in the a.m.  Dr. Lydia Sams of [facility] kindly accepted patient for admission.         Final Clinical Impression(s) / ED Diagnoses Final diagnoses:  Diverticulitis    Rx / DC Orders ED Discharge Orders     None         Sheree Dieter, MD 05/09/23 1308    Onetha Bile, MD 05/10/23 6578

## 2023-05-09 NOTE — ED Notes (Signed)
 Pt's BP still high after Labetalol IVP dose. Dr. Lydia Sams notified. Waiting for response before sending to floor

## 2023-05-09 NOTE — Hospital Course (Signed)
 Glenn Dorsey is a 38 y.o. male with medical history significant for obesity who is admitted with sepsis due to acute sigmoid diverticulitis with associated abscess.

## 2023-05-09 NOTE — ED Triage Notes (Addendum)
 Lower abdominal pain that started a few days ago and worsened last night. Nausea, no vomiting. 1 episode of loose stool. Intermittent dysuria for 2 days, reports dark urine and urinating less than usual

## 2023-05-10 DIAGNOSIS — K5792 Diverticulitis of intestine, part unspecified, without perforation or abscess without bleeding: Principal | ICD-10-CM

## 2023-05-10 LAB — CBC
HCT: 38.3 % — ABNORMAL LOW (ref 39.0–52.0)
Hemoglobin: 11.9 g/dL — ABNORMAL LOW (ref 13.0–17.0)
MCH: 26.3 pg (ref 26.0–34.0)
MCHC: 31.1 g/dL (ref 30.0–36.0)
MCV: 84.7 fL (ref 80.0–100.0)
Platelets: 309 10*3/uL (ref 150–400)
RBC: 4.52 MIL/uL (ref 4.22–5.81)
RDW: 12.9 % (ref 11.5–15.5)
WBC: 18.5 10*3/uL — ABNORMAL HIGH (ref 4.0–10.5)
nRBC: 0 % (ref 0.0–0.2)

## 2023-05-10 LAB — COMPREHENSIVE METABOLIC PANEL WITH GFR
ALT: 58 U/L — ABNORMAL HIGH (ref 0–44)
AST: 16 U/L (ref 15–41)
Albumin: 2.9 g/dL — ABNORMAL LOW (ref 3.5–5.0)
Alkaline Phosphatase: 83 U/L (ref 38–126)
Anion gap: 14 (ref 5–15)
BUN: 6 mg/dL (ref 6–20)
CO2: 24 mmol/L (ref 22–32)
Calcium: 8.5 mg/dL — ABNORMAL LOW (ref 8.9–10.3)
Chloride: 99 mmol/L (ref 98–111)
Creatinine, Ser: 0.57 mg/dL — ABNORMAL LOW (ref 0.61–1.24)
GFR, Estimated: >60 mL/min (ref >60)
Glucose, Bld: 141 mg/dL — ABNORMAL HIGH (ref 70–99)
Potassium: 3.2 mmol/L — ABNORMAL LOW (ref 3.5–5.1)
Sodium: 137 mmol/L (ref 135–145)
Total Bilirubin: 1.1 mg/dL (ref 0.0–1.2)
Total Protein: 6.9 g/dL (ref 6.5–8.1)

## 2023-05-10 LAB — HIV ANTIBODY (ROUTINE TESTING W REFLEX): HIV Screen 4th Generation wRfx: NONREACTIVE

## 2023-05-10 LAB — MAGNESIUM: Magnesium: 1.8 mg/dL (ref 1.7–2.4)

## 2023-05-10 MED ORDER — AMLODIPINE BESYLATE 10 MG PO TABS
5.0000 mg | ORAL_TABLET | Freq: Every day | ORAL | Status: DC
  Administered 2023-05-10 – 2023-05-19 (×10): 5 mg via ORAL
  Filled 2023-05-10 (×10): qty 1

## 2023-05-10 MED ORDER — HYDROCHLOROTHIAZIDE 12.5 MG PO TABS: 12.5000 mg | ORAL_TABLET | Freq: Every day | ORAL | Status: DC

## 2023-05-10 MED ORDER — AMLODIPINE BESYLATE 10 MG PO TABS: 5.0000 mg | ORAL_TABLET | Freq: Every day | ORAL | Status: DC

## 2023-05-10 MED ORDER — ACETAMINOPHEN 325 MG PO TABS
650.0000 mg | ORAL_TABLET | Freq: Four times a day (QID) | ORAL | Status: DC | PRN
  Administered 2023-05-13 – 2023-05-17 (×5): 650 mg via ORAL
  Filled 2023-05-10 (×5): qty 2

## 2023-05-10 MED ORDER — POTASSIUM CHLORIDE CRYS ER 20 MEQ PO TBCR
40.0000 meq | EXTENDED_RELEASE_TABLET | ORAL | Status: AC
  Administered 2023-05-10 (×2): 40 meq via ORAL
  Filled 2023-05-10 (×2): qty 2

## 2023-05-10 MED ORDER — ENOXAPARIN SODIUM 40 MG/0.4ML IJ SOSY
40.0000 mg | PREFILLED_SYRINGE | Freq: Every day | INTRAMUSCULAR | Status: DC
  Administered 2023-05-10 – 2023-05-14 (×5): 40 mg via SUBCUTANEOUS
  Filled 2023-05-10 (×6): qty 0.4

## 2023-05-10 MED ORDER — OXYCODONE HCL 5 MG PO TABS
5.0000 mg | ORAL_TABLET | ORAL | Status: DC | PRN
  Administered 2023-05-10 – 2023-05-17 (×3): 5 mg via ORAL
  Filled 2023-05-10 (×4): qty 1

## 2023-05-10 MED ORDER — ACETAMINOPHEN 500 MG PO TABS
1000.0000 mg | ORAL_TABLET | Freq: Three times a day (TID) | ORAL | Status: AC
  Administered 2023-05-10 – 2023-05-13 (×9): 1000 mg via ORAL
  Filled 2023-05-10 (×9): qty 2

## 2023-05-10 NOTE — Consult Note (Signed)
 Glenn Dorsey 1985-12-15  [id number].    Requesting MD: Edith Gores MD Chief Complaint/Reason for Consult: diverticulitis with perforation and abscess  HPI:  Glenn Dorsey is a 38 y/o M with PMH asthma who presents with worsening abdominal pain. Reports Saturday 4/26 he developed lower abdominal pain as well as low back pain. Over the next few days he developed more severe sharp pain, suprapubic pain with urination, and fever. Other associated symptoms include poor PO intake, nausea, chills, and constipation. He reports mild relief in pain with taking DayQuil, which he attributes to the acetaminophen  in that medication. He denies similar pain in the past. He states his last BM was yesterday AM and was normal in color. Denies pneumaturia, hematochezia, melena, or vomiting. He denies a history of abdominal surgery. Denies ever having a colonoscopy. Reports smoking THC every other day and drinking alcochol every 1-2 months. Otherwise he  denies any tobacco or drug use.    ROS: Review of Systems  All other systems reviewed and are negative.   Family History  Problem Relation Age of Onset   Diabetes Mother    Diabetes Father     Past Medical History:  Diagnosis Date   Asthma    not since early 20's   Pre-diabetes     Past Surgical History:  Procedure Laterality Date   GANGLION CYST EXCISION Left 08/10/2022   Procedure: Excision of left volar carpal ganglion cyst;  Surgeon: Marilyn Shropshire, MD;  Location: [facility] OR;  Service: Orthopedics;  Laterality: Left;  regional 46   NO PAST SURGERIES      Social History:  reports that he has quit smoking. His smoking use included cigarettes. He does not have any smokeless tobacco history on file. He reports that he does not drink alcohol and does not use drugs.  Allergies:  Allergies  Allergen Reactions   Peanut Butter Flavoring Agent (Non-Screening) Anaphylaxis    Medications Prior to Admission  Medication Sig Dispense Refill    albuterol (VENTOLIN HFA) 108 (90 Base) MCG/ACT inhaler Inhale 1-2 puffs into the lungs every 4 (four) hours as needed for wheezing or shortness of breath.     EPINEPHrine 0.3 mg/0.3 mL IJ SOAJ injection Inject 0.3 mg into the muscle as needed for anaphylaxis.     ibuprofen (ADVIL) 200 MG tablet Take 200-800 mg by mouth every 6 (six) hours as needed for moderate pain (pain score 4-6).     OVER THE COUNTER MEDICATION Take 1 tablet by mouth daily. Allergen Tablet     Pseudoephedrine-APAP-DM (DAYQUIL MULTI-SYMPTOM PO) Take 1 Dose by mouth every 4 (four) hours as needed (Cold/cough).       Physical Exam: Blood pressure (!) 187/121, pulse 99, temperature 98 F (36.7 C), temperature source Oral, resp. rate 18, height 5\' 6"  (1.676 m), weight (!) 140.6 kg, SpO2 93%. General: Pleasant black male  laying on hospital bed, appears stated age, NAD. HEENT: head -normocephalic, atraumatic; Eyes: PERRLA, no conjunctival injection, anicteric sclerae Neck- Trachea is midline, no thyromegaly or JVD appreciated.  CV- RRR, normal S1/S2, no M/R/G, no lower extremity edema  Pulm- breathing is non-labored ORA Abd- soft, obese, non-tender, mild distended, no palpable hernias or masses  GU- deferred  MSK- UE/LE symmetrical, no cyanosis, clubbing, or edema. Neuro- non-focal exam, no paresthesias. Psych- Alert and Oriented x3 with appropriate affect Skin: warm and dry, no rashes or lesions   Results for orders placed or performed during the hospital encounter of 05/09/23 (from the past 48  hours)  Comprehensive metabolic panel     Status: Abnormal   Collection Time: 05/09/23  6:41 PM  Result Value Ref Range   Sodium 135 135 - 145 mmol/L   Potassium 3.1 (L) 3.5 - 5.1 mmol/L   Chloride 96 (L) 98 - 111 mmol/L   CO2 27 22 - 32 mmol/L   Glucose, Bld 117 (H) 70 - 99 mg/dL    Comment: Glucose reference range applies only to samples taken after fasting for at least 8 hours.   BUN 7 6 - 20 mg/dL   Creatinine, Ser 1.61  (L) 0.61 - 1.24 mg/dL   Calcium 9.1 8.9 - 09.6 mg/dL   Total Protein 8.3 (H) 6.5 - 8.1 g/dL   Albumin 3.7 3.5 - 5.0 g/dL   AST 28 15 - 41 U/L   ALT 83 (H) 0 - 44 U/L   Alkaline Phosphatase 100 38 - 126 U/L   Total Bilirubin 1.1 0.0 - 1.2 mg/dL   GFR, Estimated >04 >54 mL/min    Comment: (NOTE) Calculated using the CKD-EPI Creatinine Equation (2021)    Anion gap 12 5 - 15    Comment: Performed at [facility], [address] [place], [place] [address]  CBC with Differential     Status: Abnormal   Collection Time: 05/09/23  6:41 PM  Result Value Ref Range   WBC 19.4 (H) 4.0 - 10.5 K/uL   RBC 5.32 4.22 - 5.81 MIL/uL   Hemoglobin 13.9 13.0 - 17.0 g/dL   HCT 91.4 78.2 - 95.6 %   MCV 83.5 80.0 - 100.0 fL   MCH 26.1 26.0 - 34.0 pg   MCHC 31.3 30.0 - 36.0 g/dL   RDW 21.3 08.6 - 57.8 %   Platelets 340 150 - 400 K/uL   nRBC 0.0 0.0 - 0.2 %   Neutrophils Relative % 80 %   Neutro Abs 15.6 (H) 1.7 - 7.7 K/uL   Lymphocytes Relative 11 %   Lymphs Abs 2.2 0.7 - 4.0 K/uL   Monocytes Relative 6 %   Monocytes Absolute 1.2 (H) 0.1 - 1.0 K/uL   Eosinophils Relative 1 %   Eosinophils Absolute 0.2 0.0 - 0.5 K/uL   Basophils Relative 1 %   Basophils Absolute 0.1 0.0 - 0.1 K/uL   Immature Granulocytes 1 %   Abs Immature Granulocytes 0.10 (H) 0.00 - 0.07 K/uL    Comment: Performed at [facility], [address], [place], [place] [address]  Blood culture (routine x 2)     Status: None (Preliminary result)   Collection Time: 05/09/23  6:41 PM   Specimen: BLOOD  Result Value Ref Range   Specimen Description      BLOOD RIGHT ANTECUBITAL Performed at [facility], [address] [place], [place] [address]    Special Requests      BOTTLES DRAWN AEROBIC AND ANAEROBIC Blood Culture adequate volume Performed at [facility], [address], [place], [place] [address]    Culture      NO GROWTH < 12 HOURS Performed at  [facility], [address], [place], [place] [address]    Report Status PENDING   Urinalysis, w/ Reflex to Culture (Infection Suspected) -Urine, Clean Catch     Status: Abnormal   Collection Time: 05/09/23  6:48 PM  Result Value Ref Range   Specimen Source URINE, CLEAN CATCH    Color, Urine YELLOW YELLOW   APPearance CLEAR CLEAR  Specific Gravity, Urine 1.019 1.005 - 1.030   pH 5.0 5.0 - 8.0   Glucose, UA NEGATIVE NEGATIVE mg/dL   Hgb urine dipstick SMALL (A) NEGATIVE   Bilirubin Urine NEGATIVE NEGATIVE   Ketones, ur NEGATIVE NEGATIVE mg/dL   Protein, ur 30 (A) NEGATIVE mg/dL   Nitrite NEGATIVE NEGATIVE   Leukocytes,Ua NEGATIVE NEGATIVE   RBC / HPF 0-5 0 - 5 RBC/hpf   WBC, UA 0-5 0 - 5 WBC/hpf    Comment:        Reflex urine culture not performed if WBC <=10, OR if Squamous epithelial cells >5. If Squamous epithelial cells >5 suggest recollection.    Bacteria, UA NONE SEEN NONE SEEN   Squamous Epithelial / HPF 0-5 0 - 5 /HPF   Mucus PRESENT     Comment: Performed at [facility], [address] [place], [place] [address]  Resp panel by RT-PCR (RSV, Flu A&B, Covid) Anterior Nasal Swab     Status: None   Collection Time: 05/09/23  6:48 PM   Specimen: Anterior Nasal Swab  Result Value Ref Range   SARS Coronavirus 2 by RT PCR NEGATIVE NEGATIVE    Comment: (NOTE) SARS-CoV-2 target nucleic acids are NOT DETECTED.  The SARS-CoV-2 RNA is generally detectable in upper respiratory specimens during the acute phase of infection. The lowest concentration of SARS-CoV-2 viral copies this assay can detect is 138 copies/mL. A negative result does not preclude SARS-Cov-2 infection and should not be used as the sole basis for treatment or other patient management decisions. A negative result may occur with  improper specimen collection/handling, submission of specimen other than nasopharyngeal swab, presence of viral mutation(s) within the areas targeted by  this assay, and inadequate number of viral copies(<138 copies/mL). A negative result must be combined with clinical observations, patient history, and epidemiological information. The expected result is Negative.  Fact Sheet for Patients:  BloggerCourse.com  Fact Sheet for Healthcare Providers:  SeriousBroker.it  This test is no t yet approved or cleared by the [place]  FDA and  has been authorized for detection and/or diagnosis of SARS-CoV-2 by FDA under an Emergency Use Authorization (EUA). This EUA will remain  in effect (meaning this test can be used) for the duration of the COVID-19 declaration under Section 564(b)(1) of the Act, 21 U.S.C.section 360bbb-3(b)(1), unless the authorization is terminated  or revoked sooner.       Influenza A by PCR NEGATIVE NEGATIVE   Influenza B by PCR NEGATIVE NEGATIVE    Comment: (NOTE) The Xpert Xpress SARS-CoV-2/FLU/RSV plus assay is intended as an aid in the diagnosis of influenza from Nasopharyngeal swab specimens and should not be used as a sole basis for treatment. Nasal washings and aspirates are unacceptable for Xpert Xpress SARS-CoV-2/FLU/RSV testing.  Fact Sheet for Patients: BloggerCourse.com  Fact Sheet for Healthcare Providers: SeriousBroker.it  This test is not yet approved or cleared by the [place]  FDA and has been authorized for detection and/or diagnosis of SARS-CoV-2 by FDA under an Emergency Use Authorization (EUA). This EUA will remain in effect (meaning this test can be used) for the duration of the COVID-19 declaration under Section 564(b)(1) of the Act, 21 U.S.C. section 360bbb-3(b)(1), unless the authorization is terminated or revoked.     Resp Syncytial Virus by PCR NEGATIVE NEGATIVE    Comment: (NOTE) Fact Sheet for Patients: BloggerCourse.com  Fact Sheet for Healthcare  Providers: SeriousBroker.it  This test is not yet approved or cleared by the [place]  FDA  and has been authorized for detection and/or diagnosis of SARS-CoV-2 by FDA under an Emergency Use Authorization (EUA). This EUA will remain in effect (meaning this test can be used) for the duration of the COVID-19 declaration under Section 564(b)(1) of the Act, 21 U.S.C. section 360bbb-3(b)(1), unless the authorization is terminated or revoked.  Performed at [facility], [address] [place], [place] [address]   I-Stat Lactic Acid, ED     Status: None   Collection Time: 05/09/23  6:50 PM  Result Value Ref Range   Lactic Acid, Venous 1.1 0.5 - 1.9 mmol/L  Blood culture (routine x 2)     Status: None (Preliminary result)   Collection Time: 05/09/23  8:54 PM   Specimen: BLOOD LEFT ARM  Result Value Ref Range   Specimen Description      BLOOD LEFT ARM Performed at [facility], [address], [place], [place] [address]    Special Requests      BOTTLES DRAWN AEROBIC AND ANAEROBIC Blood Culture adequate volume Performed at [facility], [address], [place], [place] [address]    Culture      NO GROWTH < 12 HOURS Performed at [facility], [address] [place], [place] [address]    Report Status PENDING   I-Stat Lactic Acid, ED     Status: None   Collection Time: 05/09/23  9:11 PM  Result Value Ref Range   Lactic Acid, Venous 1.2 0.5 - 1.9 mmol/L  Magnesium     Status: None   Collection Time: 05/10/23  4:02 AM  Result Value Ref Range   Magnesium 1.8 1.7 - 2.4 mg/dL    Comment: Performed at [facility], [address], [place], [place] [address]  CBC     Status: Abnormal   Collection Time: 05/10/23  4:02 AM  Result Value Ref Range   WBC 18.5 (H) 4.0 - 10.5 K/uL   RBC 4.52 4.22 - 5.81 MIL/uL   Hemoglobin 11.9 (L) 13.0 - 17.0 g/dL   HCT 13.0 (L) 86.5 - 78.4 %   MCV 84.7 80.0 -  100.0 fL   MCH 26.3 26.0 - 34.0 pg   MCHC 31.1 30.0 - 36.0 g/dL   RDW 69.6 29.5 - 28.4 %   Platelets 309 150 - 400 K/uL   nRBC 0.0 0.0 - 0.2 %    Comment: Performed at [facility], [address], [place], [place] [address]  Comprehensive metabolic panel     Status: Abnormal   Collection Time: 05/10/23  4:02 AM  Result Value Ref Range   Sodium 137 135 - 145 mmol/L   Potassium 3.2 (L) 3.5 - 5.1 mmol/L   Chloride 99 98 - 111 mmol/L   CO2 24 22 - 32 mmol/L   Glucose, Bld 141 (H) 70 - 99 mg/dL    Comment: Glucose reference range applies only to samples taken after fasting for at least 8 hours.   BUN 6 6 - 20 mg/dL   Creatinine, Ser 0.10 (L) 0.61 - 1.24 mg/dL   Calcium 8.5 (L) 8.9 - 10.3 mg/dL   Total Protein 6.9 6.5 - 8.1 g/dL   Albumin 2.9 (L) 3.5 - 5.0 g/dL   AST 16 15 - 41 U/L   ALT 58 (H) 0 - 44 U/L   Alkaline Phosphatase 83 38 - 126 U/L   Total Bilirubin 1.1 0.0 - 1.2 mg/dL   GFR, Estimated >27 >25  mL/min    Comment: (NOTE) Calculated using the CKD-EPI Creatinine Equation (2021)    Anion gap 14 5 - 15    Comment: Performed at [facility], [address], [place], [place] [address]   CT ABDOMEN PELVIS W CONTRAST Result Date: 05/09/2023 CLINICAL DATA:  Sepsis. Lower abdominal pain starting a few days ago. Nausea. Loose stools. Dysuria for 2 days. EXAM: CT ABDOMEN AND PELVIS WITH CONTRAST TECHNIQUE: Multidetector CT imaging of the abdomen and pelvis was performed using the standard protocol following bolus administration of intravenous contrast. RADIATION DOSE REDUCTION: This exam was performed according to the departmental dose-optimization program which includes automated exposure control, adjustment of the mA and/or kV according to patient size and/or use of iterative reconstruction technique. CONTRAST:  OMNIPAQUE IOHEXOL 300 MG/ML  SOLN COMPARISON:  Chest radiograph 05/09/2023 FINDINGS: Lower chest: Lung bases are clear. Hepatobiliary:  Mild diffuse fatty infiltration of the liver. No focal liver lesions. Gallbladder and bile ducts are normal. Pancreas: Unremarkable. No pancreatic ductal dilatation or surrounding inflammatory changes. Spleen: Normal in size without focal abnormality. Adrenals/Urinary Tract: Adrenal glands are unremarkable. Kidneys are normal, without renal calculi, focal lesion, or hydronephrosis. Bladder is unremarkable. Stomach/Bowel: Stomach, small bowel, and colon are mostly decompressed. Diverticulosis of the sigmoid colon. There is infiltration in the pelvic fat around the sigmoid colon. Within the sigmoid C-loop, there is an abscess with surrounding fat stranding. The abscess measures 3.1 cm diameter. Adjacent sigmoid colonic wall thickening. Changes are consistent with acute diverticulitis with pericolonic abscess. The appendix is normal. Vascular/Lymphatic: No significant vascular findings are present. No enlarged abdominal or pelvic lymph nodes. Reproductive: Prostate is unremarkable. Other: No free air in the abdomen. No free fluid. Abdominal wall musculature appears intact. Musculoskeletal: No acute or significant osseous findings. IMPRESSION: 1. Colonic diverticulosis. Acute diverticulitis of the sigmoid colon with pericolonic stranding and abscess measuring 3.1 cm diameter. 2. No evidence of bowel obstruction.  No free air. Electronically Signed   By: Boyce Byes M.D.   On: 05/09/2023 22:16   DG Chest Portable 1 View Result Date: 05/09/2023 CLINICAL DATA:  Lower abdominal pain.  Sepsis. EXAM: PORTABLE CHEST 1 VIEW COMPARISON:  February 21, 2013. FINDINGS: The heart size and mediastinal contours are within normal limits. Both lungs are clear. The visualized skeletal structures are unremarkable. IMPRESSION: No active disease. Electronically Signed   By: Rosalene Colon M.D.   On: 05/09/2023 17:56      Assessment/Plan Sigmoid diverticulitis with 3.0 cm gas/fluid collection - afebrile, hemodynamically  stable  - WBC 18 - IR reviewed CT and has no safe window for percutaneous drainage of abscess - continue IV abx, ok for CLD from [facility] standpoint, minimal abd tenderness currently  - no emergent surgical needs - hemodynamically stable without peritonitis. Hopefully he will improve with non-operative measures. If so, would recommend outpatient colonoscopy in about 6 weeks. If he clinically worsens or does not improve then he would need surgery this admission which would be open partial colectomy/colostomy.    FEN - ok to start CLD VTE - SCD's, Lovenox ID - Rocephin/Flagyl Admit - TRH service  Asthma Obesity    I reviewed nursing notes, ED provider notes, hospitalist notes, last 24 h vitals and pain scores, last 48 h intake and output, last 24 h labs and trends, and last 24 h imaging results.  Charlott Converse, [facility] Surgery 05/10/2023, 9:27 AM Please see Amion for pager number during day hours 7:00am-4:30pm or 7:00am -  11:30am on weekends

## 2023-05-10 NOTE — TOC CM/SW Note (Signed)
 Transition of Care [facility]) - Inpatient Brief Assessment   Patient Details  Name: Ki Akerman MRN: [id number] Date of Birth: [birth year]  Transition of Care [facility]) CM/SW Contact:    Gertha Ku, LCSW Phone Number: 05/10/2023, 1:25 PM    Transition of Care Asessment: Insurance and Status: Insurance coverage has been reviewed Patient has primary care physician: Yes Home environment has been reviewed: from home Prior level of function:: independent Prior/Current Home Services: No current home services Social Drivers of Health Review: SDOH reviewed no interventions necessary Readmission risk has been reviewed: Yes Transition of care needs: no transition of care needs at this time

## 2023-05-10 NOTE — Plan of Care (Signed)
   Problem: Fluid Volume: Goal: Hemodynamic stability will improve Outcome: Progressing   Problem: Clinical Measurements: Goal: Diagnostic test results will improve Outcome: Progressing Goal: Signs and symptoms of infection will decrease Outcome: Progressing   Problem: Respiratory: Goal: Ability to maintain adequate ventilation will improve Outcome: Progressing

## 2023-05-10 NOTE — Progress Notes (Signed)
 PROGRESS NOTE    Glenn Dorsey  NGE:[id number] DOB: 24-Aug-1985 DOA: Dorsey/29/2025 PCP: Glenn Boos, FNP  Chief Complaint  Patient presents with   Abdominal Pain    Brief Narrative:   38 y.o. male with medical history significant for obesity who presented to the ED for evaluation of lower abdominal pain.  CT findings concerning for complicated diverticulitis.   Assessment & Plan:   Principal Problem:   Sepsis (HCC) Active Problems:   Abscess of sigmoid colon due to diverticulitis   Hypokalemia  Sepsis  Complicated Diverticulitis with Abscess Met criteria for sepsis with fever, tachycardia, leukocytosis and diverticulitis with abscess CT with diverticulitis of the sigmoid colon with pericolonic stranding and abscess measuring 3.1 cm in diameter Appreciate surgery assistance, ok for CLD - no emergent surgical needs - needs outpatient colonoscopy in 6 weeks Appreciate IR assistance, area in question too small for drain placement and surrounded by loops of bowel -> recommending abx and follow up imaging in Glenn Dorsey few days  Severe Asymptomatic Hypertension Glenn Dorsey component likely related to pain Ensure cuff appropriately sized (borderline, but appropriate when I checked) Started on amlodipine Continue prn labetalol  Adjust prn   Hypokalemia Follow  Obesity Body mass index is 50.04 kg/m.    DVT prophylaxis: SCD Code Status: full Family Communication: none Disposition:   Status is: Inpatient Remains inpatient appropriate because: need for ongoing inpt care   Consultants:  Surgery IR  Procedures:  none  Antimicrobials:  Anti-infectives (From admission, onward)    Start     Dose/Rate Route Frequency Ordered Stop   05/09/23 2230  metroNIDAZOLE (FLAGYL) IVPB 500 mg        500 mg 100 mL/hr over 60 Minutes Intravenous Every 12 hours 05/09/23 2220     05/09/23 1745  cefTAZidime (FORTAZ) 2 g in sodium chloride  0.9 % 100 mL IVPB  Status:  Discontinued        2 g 200  mL/hr over 30 Minutes Intravenous  Once 05/09/23 1735 05/09/23 1735   05/09/23 1745  cefTRIAXone (ROCEPHIN) 2 g in sodium chloride  0.9 % 100 mL IVPB        2 g 200 mL/hr over 30 Minutes Intravenous Every 24 hours 05/09/23 1736         Subjective: Pain about Glenn Dorsey at the time of my evaluation, fluctuating  Objective: Vitals:   05/10/23 0950 05/10/23 1025 05/10/23 1316 05/10/23 1421  BP: (!) 199/119 (!) 172/111 (!) 179/120 (!) 161/100  Pulse:  88 95 93  Resp: 13     Temp:   99.6 F (37.6 C)   TempSrc:   Oral   SpO2:   95%   Weight:      Height:        Intake/Output Summary (Last 24 hours) at Dorsey/30/2025 1636 Last data filed at Dorsey/30/2025 1500 Gross per 24 hour  Intake 820.41 ml  Output 350 ml  Net 470.41 ml   Filed Weights   05/09/23 1644  Weight: (!) 140.6 kg    Examination:  General exam: Appears calm and comfortable  Respiratory system: unlabored Cardiovascular system: RRR Gastrointestinal system: suprapubic and LLQ abdominal pain Central nervous system: Alert and oriented. No focal neurological deficits. Extremities: no LEE   Data Reviewed: I have personally reviewed following labs and imaging studies  CBC: Recent Labs  Lab 05/09/23 1841 05/10/23 0402  WBC 19.Dorsey* 18.5*  NEUTROABS 15.6*  --   HGB 13.9 11.9*  HCT 44.Dorsey 38.3*  MCV 83.5 84.7  PLT 340 309    Basic Metabolic Panel: Recent Labs  Lab 05/09/23 1841 05/10/23 0402  NA 135 137  K 3.1* 3.2*  CL 96* 99  CO2 27 24  GLUCOSE 117* 141*  BUN 7 6  CREATININE 0.52* 0.57*  CALCIUM 9.1 8.5*  MG  --  1.8    GFR: Estimated Creatinine Clearance: 170.6 mL/min (Glenn Dorsey) (by C-G formula based on SCr of 0.57 mg/dL (L)).  Liver Function Tests: Recent Labs  Lab 05/09/23 1841 05/10/23 0402  AST 28 16  ALT 83* 58*  ALKPHOS 100 83  BILITOT 1.1 1.1  PROT 8.3* 6.9  ALBUMIN 3.7 2.9*    CBG: No results for input(s): "GLUCAP" in the last 168 hours.   Recent Results (from the past 240 hours)  Blood  culture (routine x 2)     Status: None (Preliminary Dorsey)   Collection Time: 05/09/23  6:41 PM   Specimen: BLOOD  Dorsey Value Ref Range Status   Specimen Description   Final    BLOOD RIGHT ANTECUBITAL Performed at [facility], [address], [place], [place] [address]    Special Requests   Final    BOTTLES DRAWN AEROBIC AND ANAEROBIC Blood Culture adequate volume Performed at [facility], [address], [place], [place] [address]    Culture   Final    NO GROWTH < 12 HOURS Performed at [facility], [address] [place], [place] [address]    Report Status PENDING  Incomplete  Resp panel by RT-PCR (RSV, Flu Glenn Dorsey, Covid) Anterior Nasal Swab     Status: None   Collection Time: 05/09/23  6:48 PM   Specimen: Anterior Nasal Swab  Dorsey Value Ref Range Status   SARS Coronavirus 2 by RT PCR NEGATIVE NEGATIVE Final    Comment: (NOTE) SARS-CoV-2 target nucleic acids are NOT DETECTED.  The SARS-CoV-2 RNA is generally detectable in upper respiratory specimens during the acute phase of infection. The lowest concentration of SARS-CoV-2 viral copies this assay can detect is 138 copies/mL. Glenn Dorsey negative Dorsey does not preclude SARS-Cov-2 infection and should not be used as the sole basis for treatment or other patient management decisions. Glenn Dorsey may occur with  improper specimen collection/handling, submission of specimen other than nasopharyngeal swab, presence of viral mutation(s) within the areas targeted by this assay, and inadequate number of viral copies(<138 copies/mL). Glenn Dorsey negative Dorsey must be combined with clinical observations, patient history, and epidemiological information. The expected Dorsey is Negative.  Fact Sheet for Patients:  BloggerCourse.com  Fact Sheet for Healthcare Providers:  SeriousBroker.it  This test is no t yet approved or cleared by the [name] FDA and  has been authorized for detection and/or diagnosis of SARS-CoV-2 by FDA under an Emergency Use Authorization (EUA). This EUA will remain  in effect (meaning this test can be used) for the duration of the COVID-19 declaration under Section 564(b)(1) of the Act, 21 U.S.C.section 360bbb-3(b)(1), unless the authorization is terminated  or revoked sooner.       Influenza Meha Vidrine by PCR NEGATIVE NEGATIVE Final   Influenza B by PCR NEGATIVE NEGATIVE Final    Comment: (NOTE) The Xpert Xpress SARS-CoV-2/FLU/RSV plus assay is intended as an aid in the diagnosis of influenza from Nasopharyngeal swab specimens and should not be used as Curstin Schmale sole basis for treatment. Nasal washings and aspirates are unacceptable for Xpert Xpress SARS-CoV-2/FLU/RSV testing.  Fact Sheet for Patients: BloggerCourse.com  Fact Sheet for Healthcare  Providers: SeriousBroker.it  This test is not yet approved or cleared by the [place]  FDA and has been authorized for detection and/or diagnosis of SARS-CoV-2 by FDA under an Emergency Use Authorization (EUA). This EUA will remain in effect (meaning this test can be used) for the duration of the COVID-19 declaration under Section 564(b)(1) of the Act, 21 U.S.C. section 360bbb-3(b)(1), unless the authorization is terminated or revoked.     Resp Syncytial Virus by PCR NEGATIVE NEGATIVE Final    Comment: (NOTE) Fact Sheet for Patients: BloggerCourse.com  Fact Sheet for Healthcare Providers: SeriousBroker.it  This test is not yet approved or cleared by the [place]  FDA and has been authorized for detection and/or diagnosis of SARS-CoV-2 by FDA under an Emergency Use Authorization (EUA). This EUA will remain in effect (meaning this test can be used) for the duration of the COVID-19 declaration under Section 564(b)(1) of the Act, 21 U.S.C. section  360bbb-3(b)(1), unless the authorization is terminated or revoked.  Performed at [facility], [address], [place], [place] [address]   Blood culture (routine x 2)     Status: None (Preliminary Dorsey)   Collection Time: 05/09/23  8:54 PM   Specimen: BLOOD LEFT ARM  Dorsey Value Ref Range Status   Specimen Description   Final    BLOOD LEFT ARM Performed at [facility], [address], [place], [place] [address]    Special Requests   Final    BOTTLES DRAWN AEROBIC AND ANAEROBIC Blood Culture adequate volume Performed at [facility], [address] [place], [place] [address]    Culture   Final    NO GROWTH < 12 HOURS Performed at [facility], [address], [place], [place] [address]    Report Status PENDING  Incomplete         Radiology Studies: CT ABDOMEN PELVIS W CONTRAST Dorsey Date: Dorsey/29/2025 CLINICAL DATA:  Sepsis. Lower abdominal pain starting Maguire Killmer few days ago. Nausea. Loose stools. Dysuria for 2 days. EXAM: CT ABDOMEN AND PELVIS WITH CONTRAST TECHNIQUE: Multidetector CT imaging of the abdomen and pelvis was performed using the standard protocol following bolus administration of intravenous contrast. RADIATION DOSE REDUCTION: This exam was performed according to the departmental dose-optimization program which includes automated exposure control, adjustment of the mA and/or kV according to patient size and/or use of iterative reconstruction technique. CONTRAST:  OMNIPAQUE IOHEXOL 300 MG/ML  SOLN COMPARISON:  Chest radiograph 05/09/2023 FINDINGS: Lower chest: Lung bases are clear. Hepatobiliary: Mild diffuse fatty infiltration of the liver. No focal liver lesions. Gallbladder and bile ducts are normal. Pancreas: Unremarkable. No pancreatic ductal dilatation or surrounding inflammatory changes. Spleen: Normal in size without focal abnormality. Adrenals/Urinary Tract: Adrenal glands are unremarkable. Kidneys are normal,  without renal calculi, focal lesion, or hydronephrosis. Bladder is unremarkable. Stomach/Bowel: Stomach, small bowel, and colon are mostly decompressed. Diverticulosis of the sigmoid colon. There is infiltration in the pelvic fat around the sigmoid colon. Within the sigmoid C-loop, there is an abscess with surrounding fat stranding. The abscess measures 3.1 cm diameter. Adjacent sigmoid colonic wall thickening. Changes are consistent with acute diverticulitis with pericolonic abscess. The appendix is normal. Vascular/Lymphatic: No significant vascular findings are present. No enlarged abdominal or pelvic lymph nodes. Reproductive: Prostate is unremarkable. Other: No free air in the abdomen. No free fluid. Abdominal wall musculature appears intact. Musculoskeletal: No acute or significant osseous findings. IMPRESSION: 1. Colonic diverticulosis. Acute diverticulitis of the sigmoid colon with pericolonic  stranding and abscess measuring 3.1 cm diameter. 2. No evidence of bowel obstruction.  No free air. Electronically Signed   By: Boyce Byes M.D.   On: 05/09/2023 22:16   DG Chest Portable 1 View Dorsey Date: Dorsey/29/2025 CLINICAL DATA:  Lower abdominal pain.  Sepsis. EXAM: PORTABLE CHEST 1 VIEW COMPARISON:  February 21, 2013. FINDINGS: The heart size and mediastinal contours are within normal limits. Both lungs are clear. The visualized skeletal structures are unremarkable. IMPRESSION: No active disease. Electronically Signed   By: Rosalene Colon M.D.   On: 05/09/2023 17:56        Scheduled Meds:  amLODipine  5 mg Oral Daily   sodium chloride  flush  3 mL Intravenous Q12H   Continuous Infusions:  cefTRIAXone (ROCEPHIN)  IV Stopped (05/09/23 2057)   metronidazole Stopped (05/10/23 1116)     LOS: 1 day    Time spent: over 30 min    Donnetta Gains, MD [facility]   To contact the attending provider between 7A-7P or the covering provider during after hours 7P-7A, please log into the  web site [email or web address] and access using universal [place] password for that web site. If you do not have the password, please call the hospital operator.  Dorsey/30/2025, Dorsey:36 PM

## 2023-05-10 NOTE — Progress Notes (Signed)
 Patient ID: Glenn Dorsey, male   DOB: [birth year], 37 y.o.   MRN: [id number] Request received for possible image guided drainage of abdominal abscess in pt. Latest imaging studies were reviewed by Dr. Marlena Sima and area in question is too small for drain placement and surrounded by loops of bowel. Recommend continued antbx therapy and f/u imaging in a few days.

## 2023-05-11 DIAGNOSIS — K5792 Diverticulitis of intestine, part unspecified, without perforation or abscess without bleeding: Secondary | ICD-10-CM | POA: Diagnosis not present

## 2023-05-11 LAB — COMPREHENSIVE METABOLIC PANEL WITH GFR
ALT: 47 U/L — ABNORMAL HIGH (ref 0–44)
AST: 17 U/L (ref 15–41)
Albumin: 3 g/dL — ABNORMAL LOW (ref 3.5–5.0)
Alkaline Phosphatase: 91 U/L (ref 38–126)
Anion gap: 16 — ABNORMAL HIGH (ref 5–15)
BUN: 6 mg/dL (ref 6–20)
CO2: 24 mmol/L (ref 22–32)
Calcium: 8.7 mg/dL — ABNORMAL LOW (ref 8.9–10.3)
Chloride: 95 mmol/L — ABNORMAL LOW (ref 98–111)
Creatinine, Ser: 0.83 mg/dL (ref 0.61–1.24)
GFR, Estimated: >60 mL/min (ref >60)
Glucose, Bld: 137 mg/dL — ABNORMAL HIGH (ref 70–99)
Potassium: 3.7 mmol/L (ref 3.5–5.1)
Sodium: 135 mmol/L (ref 135–145)
Total Bilirubin: 0.7 mg/dL (ref 0.0–1.2)
Total Protein: 7.5 g/dL (ref 6.5–8.1)

## 2023-05-11 LAB — CBC WITH DIFFERENTIAL/PLATELET
Abs Immature Granulocytes: 0.15 10*3/uL — ABNORMAL HIGH (ref 0.00–0.07)
Basophils Absolute: 0.1 10*3/uL (ref 0.0–0.1)
Basophils Relative: 0 %
Eosinophils Absolute: 0.3 10*3/uL (ref 0.0–0.5)
Eosinophils Relative: 1 %
HCT: 40.4 % (ref 39.0–52.0)
Hemoglobin: 12.7 g/dL — ABNORMAL LOW (ref 13.0–17.0)
Immature Granulocytes: 1 %
Lymphocytes Relative: 9 %
Lymphs Abs: 1.9 10*3/uL (ref 0.7–4.0)
MCH: 26.9 pg (ref 26.0–34.0)
MCHC: 31.4 g/dL (ref 30.0–36.0)
MCV: 85.6 fL (ref 80.0–100.0)
Monocytes Absolute: 1.3 10*3/uL — ABNORMAL HIGH (ref 0.1–1.0)
Monocytes Relative: 7 %
Neutro Abs: 16.7 10*3/uL — ABNORMAL HIGH (ref 1.7–7.7)
Neutrophils Relative %: 82 %
Platelets: 336 10*3/uL (ref 150–400)
RBC: 4.72 MIL/uL (ref 4.22–5.81)
RDW: 12.7 % (ref 11.5–15.5)
WBC: 20.4 10*3/uL — ABNORMAL HIGH (ref 4.0–10.5)
nRBC: 0 % (ref 0.0–0.2)

## 2023-05-11 LAB — PHOSPHORUS: Phosphorus: 4.8 mg/dL — ABNORMAL HIGH (ref 2.5–4.6)

## 2023-05-11 LAB — MAGNESIUM: Magnesium: 2.4 mg/dL (ref 1.7–2.4)

## 2023-05-11 MED ORDER — HYDRALAZINE HCL 20 MG/ML IJ SOLN
10.0000 mg | Freq: Once | INTRAMUSCULAR | Status: AC
  Administered 2023-05-11: 10 mg via INTRAVENOUS
  Filled 2023-05-11: qty 1

## 2023-05-11 MED ORDER — HYDROCHLOROTHIAZIDE 12.5 MG PO TABS
12.5000 mg | ORAL_TABLET | Freq: Every day | ORAL | Status: DC
  Administered 2023-05-11 – 2023-05-16 (×6): 12.5 mg via ORAL
  Filled 2023-05-11 (×6): qty 1

## 2023-05-11 MED ORDER — POTASSIUM CHLORIDE CRYS ER 20 MEQ PO TBCR
40.0000 meq | EXTENDED_RELEASE_TABLET | Freq: Once | ORAL | Status: AC
  Administered 2023-05-11: 40 meq via ORAL
  Filled 2023-05-11: qty 2

## 2023-05-11 NOTE — Progress Notes (Signed)
 Spiritual care consult received requesting prayer. Just as I arrived, he had some family visiting including some children.  I will return tomorrow, but please page if needs arise this evening--[phone].

## 2023-05-11 NOTE — Progress Notes (Signed)
 PROGRESS NOTE    Glenn Dorsey  QIO:[id number] DOB: 05/28/1985 DOA: 05/09/2023 PCP: Bari Boos, FNP  Chief Complaint  Patient presents with   Abdominal Pain    Brief Narrative:   38 y.o. male with medical history significant for obesity who presented to the ED for evaluation of lower abdominal pain.  CT findings concerning for complicated diverticulitis.   Assessment & Plan:   Principal Problem:   Sepsis (HCC) Active Problems:   Abscess of sigmoid colon due to diverticulitis   Hypokalemia   Diverticulitis  Sepsis  Complicated Diverticulitis with Abscess Met criteria for sepsis with fever, tachycardia, leukocytosis and diverticulitis with abscess CT with diverticulitis of the sigmoid colon with pericolonic stranding and abscess measuring 3.1 cm in diameter Appreciate surgery assistance, ok for CLD (vomited yesterday when he first tried clears, will see how he does today) - no emergent surgical needs - needs outpatient colonoscopy in 6 weeks  Leukocytosis mildly worse today, will trend Appreciate IR assistance, area in question too small for drain placement and surrounded by loops of bowel -> recommending abx and follow up imaging in [place] Offer few days  Severe Asymptomatic Hypertension Rashema Seawright component likely related to pain (overall improving) Ensure cuff appropriately sized (borderline, but appropriate when I checked) Amlodipine  and hydrochlorothiazide  started here. Continue prn labetalol   Adjust prn   Hypokalemia Follow  Obesity Body mass index is 50.04 kg/m.    DVT prophylaxis: SCD Code Status: full Family Communication: none Disposition:   Status is: Inpatient Remains inpatient appropriate because: need for ongoing inpt care   Consultants:  Surgery IR  Procedures:  none  Antimicrobials:  Anti-infectives (From admission, onward)    Start     Dose/Rate Route Frequency Ordered Stop   05/09/23 2230  metroNIDAZOLE  (FLAGYL ) IVPB 500 mg        500 mg 100  mL/hr over 60 Minutes Intravenous Every 12 hours 05/09/23 2220     05/09/23 1745  cefTAZidime (FORTAZ) 2 g in sodium chloride  0.9 % 100 mL IVPB  Status:  Discontinued        2 g 200 mL/hr over 30 Minutes Intravenous  Once 05/09/23 1735 05/09/23 1735   05/09/23 1745  cefTRIAXone  (ROCEPHIN ) 2 g in sodium chloride  0.9 % 100 mL IVPB        2 g 200 mL/hr over 30 Minutes Intravenous Every 24 hours 05/09/23 1736         Subjective: Pain 3-4 now Improving overall - he did vomit yesterday when he first tried clears  Objective: Vitals:   05/11/23 0415 05/11/23 0445 05/11/23 0516 05/11/23 0533  BP: (!) 187/127 (!) 169/106 (!) 159/109   Pulse:  (!) 106    Resp: 15  19   Temp:    98.9 F (37.2 C)  TempSrc:    Oral  SpO2:      Weight:      Height:        Intake/Output Summary (Last 24 hours) at 05/11/2023 1018 Last data filed at 05/11/2023 0538 Gross per 24 hour  Intake 900 ml  Output 950 ml  Net -50 ml   Filed Weights   05/09/23 1644  Weight: (!) 140.6 kg    Examination:  General: No acute distress. Cardiovascular: RRR Lungs: unlabored Abdomen:improving suprapubic discomfort Neurological: Alert and oriented 3. Moves all extremities 4 with equal strength. Cranial nerves II through XII grossly intact. Extremities: No clubbing or cyanosis. No edema.   Data Reviewed: I have personally reviewed following labs and  imaging studies  CBC: Recent Labs  Lab 05/09/23 1841 05/10/23 0402 05/11/23 0401  WBC 19.4* 18.5* 20.4*  NEUTROABS 15.6*  --  16.7*  HGB 13.9 11.9* 12.7*  HCT 44.4 38.3* 40.4  MCV 83.5 84.7 85.6  PLT 340 309 336    Basic Metabolic Panel: Recent Labs  Lab 05/09/23 1841 05/10/23 0402 05/11/23 0401  NA 135 137 135  K 3.1* 3.2* 3.7  CL 96* 99 95*  CO2 27 24 24   GLUCOSE 117* 141* 137*  BUN 7 6 6   CREATININE 0.52* 0.57* 0.83  CALCIUM 9.1 8.5* 8.7*  MG  --  1.8 2.4  PHOS  --   --  4.8*    GFR: Estimated Creatinine Clearance: 164.5 mL/min (by C-G  formula based on SCr of 0.83 mg/dL).  Liver Function Tests: Recent Labs  Lab 05/09/23 1841 05/10/23 0402 05/11/23 0401  AST 28 16 17   ALT 83* 58* 47*  ALKPHOS 100 83 91  BILITOT 1.1 1.1 0.7  PROT 8.3* 6.9 7.5  ALBUMIN 3.7 2.9* 3.0*    CBG: No results for input(s): "GLUCAP" in the last 168 hours.   Recent Results (from the past 240 hours)  Blood culture (routine x 2)     Status: None (Preliminary result)   Collection Time: 05/09/23  6:41 PM   Specimen: BLOOD  Result Value Ref Range Status   Specimen Description   Final    BLOOD RIGHT ANTECUBITAL Performed at [facility], [address] [place], [place] [address]    Special Requests   Final    BOTTLES DRAWN AEROBIC AND ANAEROBIC Blood Culture adequate volume Performed at [facility], [address], [place], [place] [address]    Culture   Final    NO GROWTH 2 DAYS Performed at [facility], [address] [place], [place] [address]    Report Status PENDING  Incomplete  Resp panel by RT-PCR (RSV, Flu Deandrae Wajda&B, Covid) Anterior Nasal Swab     Status: None   Collection Time: 05/09/23  6:48 PM   Specimen: Anterior Nasal Swab  Result Value Ref Range Status   SARS Coronavirus 2 by RT PCR NEGATIVE NEGATIVE Final    Comment: (NOTE) SARS-CoV-2 target nucleic acids are NOT DETECTED.  The SARS-CoV-2 RNA is generally detectable in upper respiratory specimens during the acute phase of infection. The lowest concentration of SARS-CoV-2 viral copies this assay can detect is 138 copies/mL. Javaris Wigington negative result does not preclude SARS-Cov-2 infection and should not be used as the sole basis for treatment or other patient management decisions. Heidi Maclin negative result may occur with  improper specimen collection/handling, submission of specimen other than nasopharyngeal swab, presence of viral mutation(s) within the areas targeted by this assay, and inadequate number of viral copies(<138 copies/mL). Wilbern Pennypacker  negative result must be combined with clinical observations, patient history, and epidemiological information. The expected result is Negative.  Fact Sheet for Patients:  BloggerCourse.com  Fact Sheet for Healthcare Providers:  SeriousBroker.it  This test is no t yet approved or cleared by the [place]  FDA and  has been authorized for detection and/or diagnosis of SARS-CoV-2 by FDA under an Emergency Use Authorization (EUA). This EUA will remain  in effect (meaning this test can be used) for the duration of the COVID-19 declaration under Section 564(b)(1) of the Act, 21 U.S.C.section 360bbb-3(b)(1), unless the authorization is terminated  or revoked sooner.       Influenza Mekhia Brogan by PCR NEGATIVE NEGATIVE  Final   Influenza B by PCR NEGATIVE NEGATIVE Final    Comment: (NOTE) The Xpert Xpress SARS-CoV-2/FLU/RSV plus assay is intended as an aid in the diagnosis of influenza from Nasopharyngeal swab specimens and should not be used as Jina Olenick sole basis for treatment. Nasal washings and aspirates are unacceptable for Xpert Xpress SARS-CoV-2/FLU/RSV testing.  Fact Sheet for Patients: BloggerCourse.com  Fact Sheet for Healthcare Providers: SeriousBroker.it  This test is not yet approved or cleared by the [place]  FDA and has been authorized for detection and/or diagnosis of SARS-CoV-2 by FDA under an Emergency Use Authorization (EUA). This EUA will remain in effect (meaning this test can be used) for the duration of the COVID-19 declaration under Section 564(b)(1) of the Act, 21 U.S.C. section 360bbb-3(b)(1), unless the authorization is terminated or revoked.     Resp Syncytial Virus by PCR NEGATIVE NEGATIVE Final    Comment: (NOTE) Fact Sheet for Patients: BloggerCourse.com  Fact Sheet for Healthcare  Providers: SeriousBroker.it  This test is not yet approved or cleared by the [place]  FDA and has been authorized for detection and/or diagnosis of SARS-CoV-2 by FDA under an Emergency Use Authorization (EUA). This EUA will remain in effect (meaning this test can be used) for the duration of the COVID-19 declaration under Section 564(b)(1) of the Act, 21 U.S.C. section 360bbb-3(b)(1), unless the authorization is terminated or revoked.  Performed at [facility], [address], [place], [place] [address]   Blood culture (routine x 2)     Status: None (Preliminary result)   Collection Time: 05/09/23  8:54 PM   Specimen: BLOOD LEFT ARM  Result Value Ref Range Status   Specimen Description   Final    BLOOD LEFT ARM Performed at [facility], [address] [place], [place] [address]    Special Requests   Final    BOTTLES DRAWN AEROBIC AND ANAEROBIC Blood Culture adequate volume Performed at [facility], [address] [place], [place] [address]    Culture   Final    NO GROWTH 2 DAYS Performed at [facility], [address], [place], [place] [address]    Report Status PENDING  Incomplete         Radiology Studies: CT ABDOMEN PELVIS W CONTRAST Result Date: 05/09/2023 CLINICAL DATA:  Sepsis. Lower abdominal pain starting Katara Griner few days ago. Nausea. Loose stools. Dysuria for 2 days. EXAM: CT ABDOMEN AND PELVIS WITH CONTRAST TECHNIQUE: Multidetector CT imaging of the abdomen and pelvis was performed using the standard protocol following bolus administration of intravenous contrast. RADIATION DOSE REDUCTION: This exam was performed according to the departmental dose-optimization program which includes automated exposure control, adjustment of the mA and/or kV according to patient size and/or use of iterative reconstruction technique. CONTRAST:  OMNIPAQUE  IOHEXOL  300 MG/ML  SOLN COMPARISON:  Chest  radiograph 05/09/2023 FINDINGS: Lower chest: Lung bases are clear. Hepatobiliary: Mild diffuse fatty infiltration of the liver. No focal liver lesions. Gallbladder and bile ducts are normal. Pancreas: Unremarkable. No pancreatic ductal dilatation or surrounding inflammatory changes. Spleen: Normal in size without focal abnormality. Adrenals/Urinary Tract: Adrenal glands are unremarkable. Kidneys are normal, without renal calculi, focal lesion, or hydronephrosis. Bladder is unremarkable. Stomach/Bowel: Stomach, small bowel, and colon are mostly decompressed. Diverticulosis of the sigmoid colon. There is infiltration in the pelvic fat around the sigmoid colon. Within the sigmoid C-loop, there is an abscess with surrounding fat stranding. The abscess measures 3.1 cm diameter. Adjacent sigmoid colonic  wall thickening. Changes are consistent with acute diverticulitis with pericolonic abscess. The appendix is normal. Vascular/Lymphatic: No significant vascular findings are present. No enlarged abdominal or pelvic lymph nodes. Reproductive: Prostate is unremarkable. Other: No free air in the abdomen. No free fluid. Abdominal wall musculature appears intact. Musculoskeletal: No acute or significant osseous findings. IMPRESSION: 1. Colonic diverticulosis. Acute diverticulitis of the sigmoid colon with pericolonic stranding and abscess measuring 3.1 cm diameter. 2. No evidence of bowel obstruction.  No free air. Electronically Signed   By: Boyce Byes M.D.   On: 05/09/2023 22:16   DG Chest Portable 1 View Result Date: 05/09/2023 CLINICAL DATA:  Lower abdominal pain.  Sepsis. EXAM: PORTABLE CHEST 1 VIEW COMPARISON:  February 21, 2013. FINDINGS: The heart size and mediastinal contours are within normal limits. Both lungs are clear. The visualized skeletal structures are unremarkable. IMPRESSION: No active disease. Electronically Signed   By: Rosalene Colon M.D.   On: 05/09/2023 17:56        Scheduled Meds:   acetaminophen   1,000 mg Oral Q8H   amLODipine   5 mg Oral Daily   enoxaparin  (LOVENOX ) injection  40 mg Subcutaneous Daily   sodium chloride  flush  3 mL Intravenous Q12H   Continuous Infusions:  cefTRIAXone  (ROCEPHIN )  IV Stopped (05/10/23 1735)   metronidazole  Stopped (05/11/23 0538)     LOS: 2 days    Time spent: over 30 min    Donnetta Gains, MD [facility]   To contact the attending provider between 7A-7P or the covering provider during after hours 7P-7A, please log into the web site [email or web address] and access using universal [place] password for that web site. If you do not have the password, please call the hospital operator.  05/11/2023, 10:18 AM

## 2023-05-11 NOTE — Progress Notes (Signed)
 [facility] Progress Note     Subjective: CC:  Reports ongoing bouts of cramping lower abdominal pain and some pelvic pressure. One episode emesis last night about 2 hours after CLD for dinner. Feels better this morning. No BM yet this admission.  Objective: Vital signs in last 24 hours: Temp:  [98.9 F (37.2 C)-99.9 F (37.7 C)] 98.9 F (37.2 C) (05/01 0533) Pulse Rate:  [88-106] 106 (05/01 0445) Resp:  [15-19] 19 (05/01 0516) BP: (159-187)/(100-127) 159/109 (05/01 0516) SpO2:  [95 %-98 %] 98 % (05/01 0255) Last BM Date : 05/09/23  Intake/Output from previous day: 04/30 0701 - 05/01 0700 In: 900 [P.O.:600; IV Piggyback:300] Out: 950 [Urine:950] Intake/Output this shift: No intake/output data recorded.  PE: Gen:  Alert, NAD, pleasant Card:  Regular rate and rhythm, pedal pulses 2+ BL Pulm:  Normal effort, clear to auscultation bilaterally Abd: Soft, obese non-tender, non-distended Skin: warm and dry, no rashes  Psych: A&Ox3   Lab Results:  Recent Labs    05/10/23 0402 05/11/23 0401  WBC 18.5* 20.4*  HGB 11.9* 12.7*  HCT 38.3* 40.4  PLT 309 336   BMET Recent Labs    05/10/23 0402 05/11/23 0401  NA 137 135  K 3.2* 3.7  CL 99 95*  CO2 24 24  GLUCOSE 141* 137*  BUN 6 6  CREATININE 0.57* 0.83  CALCIUM 8.5* 8.7*   PT/INR No results for input(s): "LABPROT", "INR" in the last 72 hours. CMP     Component Value Date/Time   NA 135 05/11/2023 0401   K 3.7 05/11/2023 0401   CL 95 (L) 05/11/2023 0401   CO2 24 05/11/2023 0401   GLUCOSE 137 (H) 05/11/2023 0401   BUN 6 05/11/2023 0401   CREATININE 0.83 05/11/2023 0401   CALCIUM 8.7 (L) 05/11/2023 0401   PROT 7.5 05/11/2023 0401   ALBUMIN 3.0 (L) 05/11/2023 0401   AST 17 05/11/2023 0401   ALT 47 (H) 05/11/2023 0401   ALKPHOS 91 05/11/2023 0401   BILITOT 0.7 05/11/2023 0401   GFRNONAA >60 05/11/2023 0401   Lipase  No results found for: "LIPASE"     Studies/Results: CT ABDOMEN PELVIS W  CONTRAST Result Date: 05/09/2023 CLINICAL DATA:  Sepsis. Lower abdominal pain starting a few days ago. Nausea. Loose stools. Dysuria for 2 days. EXAM: CT ABDOMEN AND PELVIS WITH CONTRAST TECHNIQUE: Multidetector CT imaging of the abdomen and pelvis was performed using the standard protocol following bolus administration of intravenous contrast. RADIATION DOSE REDUCTION: This exam was performed according to the departmental dose-optimization program which includes automated exposure control, adjustment of the mA and/or kV according to patient size and/or use of iterative reconstruction technique. CONTRAST:  OMNIPAQUE  IOHEXOL  300 MG/ML  SOLN COMPARISON:  Chest radiograph 05/09/2023 FINDINGS: Lower chest: Lung bases are clear. Hepatobiliary: Mild diffuse fatty infiltration of the liver. No focal liver lesions. Gallbladder and bile ducts are normal. Pancreas: Unremarkable. No pancreatic ductal dilatation or surrounding inflammatory changes. Spleen: Normal in size without focal abnormality. Adrenals/Urinary Tract: Adrenal glands are unremarkable. Kidneys are normal, without renal calculi, focal lesion, or hydronephrosis. Bladder is unremarkable. Stomach/Bowel: Stomach, small bowel, and colon are mostly decompressed. Diverticulosis of the sigmoid colon. There is infiltration in the pelvic fat around the sigmoid colon. Within the sigmoid C-loop, there is an abscess with surrounding fat stranding. The abscess measures 3.1 cm diameter. Adjacent sigmoid colonic wall thickening. Changes are consistent with acute diverticulitis with pericolonic abscess. The appendix is normal. Vascular/Lymphatic: No significant vascular findings  are present. No enlarged abdominal or pelvic lymph nodes. Reproductive: Prostate is unremarkable. Other: No free air in the abdomen. No free fluid. Abdominal wall musculature appears intact. Musculoskeletal: No acute or significant osseous findings. IMPRESSION: 1. Colonic diverticulosis. Acute  diverticulitis of the sigmoid colon with pericolonic stranding and abscess measuring 3.1 cm diameter. 2. No evidence of bowel obstruction.  No free air. Electronically Signed   By: Boyce Byes M.D.   On: 05/09/2023 22:16   DG Chest Portable 1 View Result Date: 05/09/2023 CLINICAL DATA:  Lower abdominal pain.  Sepsis. EXAM: PORTABLE CHEST 1 VIEW COMPARISON:  February 21, 2013. FINDINGS: The heart size and mediastinal contours are within normal limits. Both lungs are clear. The visualized skeletal structures are unremarkable. IMPRESSION: No active disease. Electronically Signed   By: Rosalene Colon M.D.   On: 05/09/2023 17:56    Anti-infectives: Anti-infectives (From admission, onward)    Start     Dose/Rate Route Frequency Ordered Stop   05/09/23 2230  metroNIDAZOLE  (FLAGYL ) IVPB 500 mg        500 mg 100 mL/hr over 60 Minutes Intravenous Every 12 hours 05/09/23 2220     05/09/23 1745  cefTAZidime (FORTAZ) 2 g in sodium chloride  0.9 % 100 mL IVPB  Status:  Discontinued        2 g 200 mL/hr over 30 Minutes Intravenous  Once 05/09/23 1735 05/09/23 1735   05/09/23 1745  cefTRIAXone  (ROCEPHIN ) 2 g in sodium chloride  0.9 % 100 mL IVPB        2 g 200 mL/hr over 30 Minutes Intravenous Every 24 hours 05/09/23 1736          Assessment/Plan  Sigmoid diverticulitis with 3.0 cm gas/fluid collection - afebrile, hemodynamically stable  - WBC 20 from 18, increased - IR reviewed CT and has no safe window for percutaneous drainage of abscess  - continue IV abx, may need to broaden if doesn't improve on Rocephin /Flagyl  - no emergent surgical needs - hemodynamically stable without peritonitis. Hopefully he will improve with non-operative measures. If so, would recommend outpatient colonoscopy in about 6 weeks. If he clinically worsens or does not improve then he would need surgery this admission which would be open partial colectomy/colostomy.    If WBC continues to uptrend will need repeat CT in a  couple days to re-assess abscess.     FEN -CLD VTE - SCD's, Lovenox  ID - Rocephin /Flagyl  Admit - TRH service   Asthma Obesity    LOS: 2 days   I reviewed nursing notes, hospitalist notes, last 24 h vitals and pain scores, last 48 h intake and output, last 24 h labs and trends, and last 24 h imaging results.  This care required moderate level of medical decision making.   Michial Akin, PA-C [facility] Please see Amion for pager number during day hours 7:00am-4:30pm

## 2023-05-12 DIAGNOSIS — E66813 Obesity, class 3: Secondary | ICD-10-CM | POA: Diagnosis present

## 2023-05-12 DIAGNOSIS — Z833 Family history of diabetes mellitus: Secondary | ICD-10-CM | POA: Diagnosis not present

## 2023-05-12 DIAGNOSIS — Z6841 Body Mass Index (BMI) 40.0 and over, adult: Secondary | ICD-10-CM | POA: Diagnosis not present

## 2023-05-12 DIAGNOSIS — K5792 Diverticulitis of intestine, part unspecified, without perforation or abscess without bleeding: Secondary | ICD-10-CM | POA: Diagnosis present

## 2023-05-12 DIAGNOSIS — D75838 Other thrombocytosis: Secondary | ICD-10-CM | POA: Diagnosis not present

## 2023-05-12 DIAGNOSIS — Z87891 Personal history of nicotine dependence: Secondary | ICD-10-CM | POA: Diagnosis not present

## 2023-05-12 DIAGNOSIS — J45909 Unspecified asthma, uncomplicated: Secondary | ICD-10-CM | POA: Diagnosis present

## 2023-05-12 DIAGNOSIS — K5720 Diverticulitis of large intestine with perforation and abscess without bleeding: Secondary | ICD-10-CM | POA: Diagnosis present

## 2023-05-12 DIAGNOSIS — E876 Hypokalemia: Secondary | ICD-10-CM | POA: Diagnosis present

## 2023-05-12 DIAGNOSIS — R739 Hyperglycemia, unspecified: Secondary | ICD-10-CM | POA: Diagnosis not present

## 2023-05-12 DIAGNOSIS — Z79891 Long term (current) use of opiate analgesic: Secondary | ICD-10-CM | POA: Diagnosis not present

## 2023-05-12 DIAGNOSIS — Z87892 Personal history of anaphylaxis: Secondary | ICD-10-CM | POA: Diagnosis not present

## 2023-05-12 DIAGNOSIS — I10 Essential (primary) hypertension: Secondary | ICD-10-CM | POA: Diagnosis present

## 2023-05-12 DIAGNOSIS — A419 Sepsis, unspecified organism: Secondary | ICD-10-CM | POA: Diagnosis present

## 2023-05-12 DIAGNOSIS — Z91018 Allergy to other foods: Secondary | ICD-10-CM | POA: Diagnosis not present

## 2023-05-12 DIAGNOSIS — Z1152 Encounter for screening for COVID-19: Secondary | ICD-10-CM | POA: Diagnosis not present

## 2023-05-12 LAB — CBC WITH DIFFERENTIAL/PLATELET
Abs Immature Granulocytes: 0.14 10*3/uL — ABNORMAL HIGH (ref 0.00–0.07)
Basophils Absolute: 0.1 10*3/uL (ref 0.0–0.1)
Basophils Relative: 1 %
Eosinophils Absolute: 0.5 10*3/uL (ref 0.0–0.5)
Eosinophils Relative: 3 %
HCT: 43.3 % (ref 39.0–52.0)
Hemoglobin: 13.3 g/dL (ref 13.0–17.0)
Immature Granulocytes: 1 %
Lymphocytes Relative: 11 %
Lymphs Abs: 2 10*3/uL (ref 0.7–4.0)
MCH: 26.5 pg (ref 26.0–34.0)
MCHC: 30.7 g/dL (ref 30.0–36.0)
MCV: 86.4 fL (ref 80.0–100.0)
Monocytes Absolute: 1.2 10*3/uL — ABNORMAL HIGH (ref 0.1–1.0)
Monocytes Relative: 6 %
Neutro Abs: 14.3 10*3/uL — ABNORMAL HIGH (ref 1.7–7.7)
Neutrophils Relative %: 78 %
Platelets: 389 10*3/uL (ref 150–400)
RBC: 5.01 MIL/uL (ref 4.22–5.81)
RDW: 13.1 % (ref 11.5–15.5)
WBC: 18.2 10*3/uL — ABNORMAL HIGH (ref 4.0–10.5)
nRBC: 0 % (ref 0.0–0.2)

## 2023-05-12 LAB — COMPREHENSIVE METABOLIC PANEL WITH GFR
ALT: 45 U/L — ABNORMAL HIGH (ref 0–44)
AST: 24 U/L (ref 15–41)
Albumin: 3 g/dL — ABNORMAL LOW (ref 3.5–5.0)
Alkaline Phosphatase: 101 U/L (ref 38–126)
Anion gap: 12 (ref 5–15)
BUN: 7 mg/dL (ref 6–20)
CO2: 26 mmol/L (ref 22–32)
Calcium: 8.9 mg/dL (ref 8.9–10.3)
Chloride: 98 mmol/L (ref 98–111)
Creatinine, Ser: 0.71 mg/dL (ref 0.61–1.24)
GFR, Estimated: >60 mL/min (ref >60)
Glucose, Bld: 134 mg/dL — ABNORMAL HIGH (ref 70–99)
Potassium: 3.4 mmol/L — ABNORMAL LOW (ref 3.5–5.1)
Sodium: 136 mmol/L (ref 135–145)
Total Bilirubin: 0.8 mg/dL (ref 0.0–1.2)
Total Protein: 7.7 g/dL (ref 6.5–8.1)

## 2023-05-12 LAB — MAGNESIUM: Magnesium: 2.3 mg/dL (ref 1.7–2.4)

## 2023-05-12 LAB — PHOSPHORUS: Phosphorus: 4.2 mg/dL (ref 2.5–4.6)

## 2023-05-12 MED ORDER — KATE FARMS STANDARD 1.4 EN LIQD
325.0000 mL | Freq: Two times a day (BID) | ENTERAL | Status: DC
  Administered 2023-05-12 – 2023-05-18 (×8): 325 mL via ORAL
  Filled 2023-05-12 (×3): qty 1000
  Filled 2023-05-12 (×2): qty 325

## 2023-05-12 NOTE — Progress Notes (Addendum)
 [facility] Progress Note     Subjective: CC:  Reports pain is stable to improved with routine pain medication. Says if we were to stop the meds he would be very uncomfortable. No nausea vomiting. No increased pain with PO intake.  Objective: Vital signs in last 24 hours: Temp:  [98.2 F (36.8 C)-99.5 F (37.5 C)] 98.5 F (36.9 C) (05/02 0555) Pulse Rate:  [96-108] 108 (05/02 0555) Resp:  [14-19] 19 (05/02 0555) BP: (166-183)/(100-128) 168/116 (05/02 0916) SpO2:  [97 %-98 %] 97 % (05/02 0555) Last BM Date : 05/11/23  Intake/Output from previous day: 05/01 0701 - 05/02 0700 In: 105.4 [IV Piggyback:105.4] Out: -  Intake/Output this shift: Total I/O In: 3 [I.V.:3] Out: -   PE: Gen:  Alert, NAD, pleasant Card:  Regular rate and rhythm, pedal pulses 2+ BL Pulm:  Normal effort, clear to auscultation bilaterally Abd: Soft, obese, overall non-tender, non-distended Skin: warm and dry, no rashes  Psych: A&Ox3   Lab Results:  Recent Labs    05/11/23 0401 05/12/23 0636  WBC 20.4* 18.2*  HGB 12.7* 13.3  HCT 40.4 43.3  PLT 336 389   BMET Recent Labs    05/11/23 0401 05/12/23 0636  NA 135 136  K 3.7 3.4*  CL 95* 98  CO2 24 26  GLUCOSE 137* 134*  BUN 6 7  CREATININE 0.83 0.71  CALCIUM 8.7* 8.9   PT/INR No results for input(s): "LABPROT", "INR" in the last 72 hours. CMP     Component Value Date/Time   NA 136 05/12/2023 0636   K 3.4 (L) 05/12/2023 0636   CL 98 05/12/2023 0636   CO2 26 05/12/2023 0636   GLUCOSE 134 (H) 05/12/2023 0636   BUN 7 05/12/2023 0636   CREATININE 0.71 05/12/2023 0636   CALCIUM 8.9 05/12/2023 0636   PROT 7.7 05/12/2023 0636   ALBUMIN 3.0 (L) 05/12/2023 0636   AST 24 05/12/2023 0636   ALT 45 (H) 05/12/2023 0636   ALKPHOS 101 05/12/2023 0636   BILITOT 0.8 05/12/2023 0636   GFRNONAA >60 05/12/2023 0636   Lipase  No results found for: "LIPASE"     Studies/Results: No results  found.   Anti-infectives: Anti-infectives (From admission, onward)    Start     Dose/Rate Route Frequency Ordered Stop   05/09/23 2230  metroNIDAZOLE  (FLAGYL ) IVPB 500 mg        500 mg 100 mL/hr over 60 Minutes Intravenous Every 12 hours 05/09/23 2220     05/09/23 1745  cefTAZidime (FORTAZ) 2 g in sodium chloride  0.9 % 100 mL IVPB  Status:  Discontinued        2 g 200 mL/hr over 30 Minutes Intravenous  Once 05/09/23 1735 05/09/23 1735   05/09/23 1745  cefTRIAXone  (ROCEPHIN ) 2 g in sodium chloride  0.9 % 100 mL IVPB        2 g 200 mL/hr over 30 Minutes Intravenous Every 24 hours 05/09/23 1736          Assessment/Plan  Sigmoid diverticulitis with 3.0 cm gas/fluid collection - afebrile, hemodynamically stable  - WBC back down to 18 from 20  - IR reviewed CT and has no safe window for percutaneous drainage of abscess  - continue IV abx - no emergent surgical needs - while he is not clinically worsening, he is not really progressing either. Will discuss timing of repeat CT with my attending but, if his abscess remains surrounded by bowel and not amenable to drainage, I am concerned  he may need an operation this admission which would be partial colectomy and colostomy. Tentatively plan for CT Sunday.    FEN -CLD, allow FLD/ensure VTE - SCD's, Lovenox  ID - Rocephin /Flagyl  Admit - TRH service   Asthma Obesity    LOS: 3 days   I reviewed nursing notes, hospitalist notes, last 24 h vitals and pain scores, last 48 h intake and output, last 24 h labs and trends, and last 24 h imaging results.  This care required moderate level of medical decision making.   Michial Akin, PA-C [facility] Please see Amion for pager number during day hours 7:00am-4:30pm

## 2023-05-12 NOTE — Progress Notes (Signed)
 MD made aware that patient had two small bowel movements that were black and green in color per patient. Patient told RN that there was not any blood in the stool. The first bowel movement patient said it was loose and mushy, and the second bowel movement patient said was more formed. RN did tell patient to not flush if he had another bowel movement so staff can look at the bowel movement. Patient had two more bowel movements after that and the bowel movements were brown in color.

## 2023-05-12 NOTE — Plan of Care (Signed)

## 2023-05-12 NOTE — Progress Notes (Signed)
 Initial Nutrition Assessment  DOCUMENTATION CODES:   Morbid obesity  INTERVENTION:  - Full Liquid diet per CCS.  [employer] 1.4 PO BID, each supplement provides 455 kcal and 20 grams protein. - Encourage intake as tolerated.  - Monitor weight trends.   NUTRITION DIAGNOSIS:   Increased nutrient needs related to acute illness as evidenced by estimated needs.  GOAL:   Patient will meet greater than or equal to 90% of their needs  MONITOR:   PO intake, Supplement acceptance, Diet advancement, Weight trends  REASON FOR ASSESSMENT:   Consult Assessment of nutrition requirement/status, Other (Comment) (supplement options)  ASSESSMENT:   38 y.o. male with PMH significant for obesity who presented with lower abdominal pain.  Admitted for sepsis with complicated diverticulitis with abscess.   Patient in bed at time of visit. Observed clear liquid breakfast tray at bedside which was 100% consumed.  Patient reports a UBW of 310# and denies any recent changes in weight.  Typically only eats 1 or 2 meals a day. Usually gets meals restaurants and often eats [place] food or pizza. Eating normally up until admission.   Patient has been on clear liquids since 4/30. Was able to advance to full liquids this AM per [facility]CS.  [facility]S requesting options for supplements as patient reports he is lactose intolerance. Inquired about symptoms after dairy and patient reports getting very gassy.  He drank an Ensure this morning and had to go to the bathroom shortly afterwards. He is agreeable to try Johny Nap for a plant based protein option.    Medications reviewed and include: -  Labs reviewed:  K+ 3.4   NUTRITION - FOCUSED PHYSICAL EXAM:  No depletions  Diet Order:   Diet Order             Diet full liquid Fluid consistency: Thin  Diet effective now                   EDUCATION NEEDS:  Education needs have been addressed  Skin:  Skin Assessment: Reviewed RN Assessment  Last BM:   5/2 - type 5  Height:  Ht Readings from Last 1 Encounters:  05/09/23 5\' 6"  (1.676 m)   Weight:  Wt Readings from Last 1 Encounters:  05/09/23 (!) 140.6 kg   Ideal Body Weight:  64.55 kg  BMI:  Body mass index is 50.04 kg/m.  Estimated Nutritional Needs:  Kcal:  2300-2500 kcals Protein:  100-130 grams Fluid:  >/= 2.3L    Scheryl Cushing RD, LDN Contact via Secure Chat.

## 2023-05-12 NOTE — Progress Notes (Signed)
 PROGRESS NOTE    Glenn Dorsey  ZOX:[id number] DOB: 08-23-1985 DOA: 05/09/2023 PCP: Glenn Boos, FNP  Chief Complaint  Patient presents with   Abdominal Pain    Brief Narrative:   38 y.o. male with medical history significant for obesity who presented to the ED for evaluation of lower abdominal pain.  CT findings concerning for complicated diverticulitis.   Assessment & Plan:   Principal Problem:   Sepsis (HCC) Active Problems:   Abscess of sigmoid colon due to diverticulitis   Hypokalemia   Diverticulitis  Sepsis  Complicated Diverticulitis with Abscess Met criteria for sepsis with fever, tachycardia, leukocytosis and diverticulitis with abscess CT with diverticulitis of the sigmoid colon with pericolonic stranding and abscess measuring 3.1 cm in diameter Appreciate surgery assistance, trial fulls - no emergent surgical needs - repeat CT per surgery --- needs outpatient colonoscopy in 6 weeks  Leukocytosis mildly worse today, will trend Appreciate IR assistance, area in question too small for drain placement and surrounded by loops of bowel -> recommending abx and follow up imaging in Glenn Dorsey few days  Severe Asymptomatic Hypertension Glenn Dorsey component likely related to pain  Ensure cuff appropriately sized (borderline, but appropriate when I checked) Amlodipine  and hydrochlorothiazide  started here. Continue prn labetalol   Adjust prn   Hypokalemia Follow  Obesity Body mass index is 50.04 kg/m.    DVT prophylaxis: SCD Code Status: full Family Communication: none Disposition:   Status is: Inpatient Remains inpatient appropriate because: need for ongoing inpt care   Consultants:  Surgery IR  Procedures:  none  Antimicrobials:  Anti-infectives (From admission, onward)    Start     Dose/Rate Route Frequency Ordered Stop   05/09/23 2230  metroNIDAZOLE  (FLAGYL ) IVPB 500 mg        500 mg 100 mL/hr over 60 Minutes Intravenous Every 12 hours 05/09/23 2220      05/09/23 1745  cefTAZidime (FORTAZ) 2 g in sodium chloride  0.9 % 100 mL IVPB  Status:  Discontinued        2 g 200 mL/hr over 30 Minutes Intravenous  Once 05/09/23 1735 05/09/23 1735   05/09/23 1745  cefTRIAXone  (ROCEPHIN ) 2 g in sodium chloride  0.9 % 100 mL IVPB        2 g 200 mL/hr over 30 Minutes Intravenous Every 24 hours 05/09/23 1736         Subjective: Feeling progressively better  Objective: Vitals:   05/11/23 2227 05/12/23 0100 05/12/23 0555 05/12/23 0916  BP: (!) 169/108  (!) 166/128 (!) 168/116  Pulse:   (!) 108   Resp: 16 14 19    Temp: 98.2 F (36.8 C)  98.5 F (36.9 C)   TempSrc: Oral  Oral   SpO2: 97%  97%   Weight:      Height:        Intake/Output Summary (Last 24 hours) at 05/12/2023 1131 Last data filed at 05/12/2023 0926 Gross per 24 hour  Intake 108.36 ml  Output --  Net 108.36 ml   Filed Weights   05/09/23 1644  Weight: (!) 140.6 kg    Examination:  General: No acute distress. Cardiovascular: RRR Lungs: unlabored Abdomen: s/nt/nd Neurological: Alert and oriented 3. Moves all extremities 4 with equal strength. Cranial nerves II through XII grossly intact. Extremities: No clubbing or cyanosis. No edema.  Data Reviewed: I have personally reviewed following labs and imaging studies  CBC: Recent Labs  Lab 05/09/23 1841 05/10/23 0402 05/11/23 0401 05/12/23 0636  WBC 19.4* 18.5* 20.4* 18.2*  NEUTROABS 15.6*  --  16.7* 14.3*  HGB 13.9 11.9* 12.7* 13.3  HCT 44.4 38.3* 40.4 43.3  MCV 83.5 84.7 85.6 86.4  PLT 340 309 336 389    Basic Metabolic Panel: Recent Labs  Lab 05/09/23 1841 05/10/23 0402 05/11/23 0401 05/12/23 0636  NA 135 137 135 136  K 3.1* 3.2* 3.7 3.4*  CL 96* 99 95* 98  CO2 27 24 24 26   GLUCOSE 117* 141* 137* 134*  BUN 7 6 6 7   CREATININE 0.52* 0.57* 0.83 0.71  CALCIUM 9.1 8.5* 8.7* 8.9  MG  --  1.8 2.4 2.3  PHOS  --   --  4.8* 4.2    GFR: Estimated Creatinine Clearance: 170.6 mL/min (by C-G formula based on SCr  of 0.71 mg/dL).  Liver Function Tests: Recent Labs  Lab 05/09/23 1841 05/10/23 0402 05/11/23 0401 05/12/23 0636  AST 28 16 17 24   ALT 83* 58* 47* 45*  ALKPHOS 100 83 91 101  BILITOT 1.1 1.1 0.7 0.8  PROT 8.3* 6.9 7.5 7.7  ALBUMIN 3.7 2.9* 3.0* 3.0*    CBG: No results for input(s): "GLUCAP" in the last 168 hours.   Recent Results (from the past 240 hours)  Blood culture (routine x 2)     Status: None (Preliminary result)   Collection Time: 05/09/23  6:41 PM   Specimen: BLOOD  Result Value Ref Range Status   Specimen Description   Final    BLOOD RIGHT ANTECUBITAL Performed at [facility], [address] [place], [place] [address]    Special Requests   Final    BOTTLES DRAWN AEROBIC AND ANAEROBIC Blood Culture adequate volume Performed at [facility], [address], [place], [place] [address]    Culture   Final    NO GROWTH 3 DAYS Performed at [facility], [address] [place], [place] [address]    Report Status PENDING  Incomplete  Resp panel by RT-PCR (RSV, Flu Glenn Dorsey&B, Covid) Anterior Nasal Swab     Status: None   Collection Time: 05/09/23  6:48 PM   Specimen: Anterior Nasal Swab  Result Value Ref Range Status   SARS Coronavirus 2 by RT PCR NEGATIVE NEGATIVE Final    Comment: (NOTE) SARS-CoV-2 target nucleic acids are NOT DETECTED.  The SARS-CoV-2 RNA is generally detectable in upper respiratory specimens during the acute phase of infection. The lowest concentration of SARS-CoV-2 viral copies this assay can detect is 138 copies/mL. Glenn Dorsey negative result does not preclude SARS-Cov-2 infection and should not be used as the sole basis for treatment or other patient management decisions. Glenn Dorsey negative result may occur with  improper specimen collection/handling, submission of specimen other than nasopharyngeal swab, presence of viral mutation(s) within the areas targeted by this assay, and inadequate number of  viral copies(<138 copies/mL). Glenn Dorsey negative result must be combined with clinical observations, patient history, and epidemiological information. The expected result is Negative.  Fact Sheet for Patients:  BloggerCourse.com  Fact Sheet for Healthcare Providers:  SeriousBroker.it  This test is no t yet approved or cleared by the [place]  FDA and  has been authorized for detection and/or diagnosis of SARS-CoV-2 by FDA under an Emergency Use Authorization (EUA). This EUA will remain  in effect (meaning this test can be used) for the duration of the COVID-19 declaration under Section 564(b)(1) of the Act, 21 U.S.C.section 360bbb-3(b)(1), unless the authorization is terminated  or revoked sooner.       Influenza  Davien Malone by PCR NEGATIVE NEGATIVE Final   Influenza B by PCR NEGATIVE NEGATIVE Final    Comment: (NOTE) The Xpert Xpress SARS-CoV-2/FLU/RSV plus assay is intended as an aid in the diagnosis of influenza from Nasopharyngeal swab specimens and should not be used as Titilayo Hagans sole basis for treatment. Nasal washings and aspirates are unacceptable for Xpert Xpress SARS-CoV-2/FLU/RSV testing.  Fact Sheet for Patients: BloggerCourse.com  Fact Sheet for Healthcare Providers: SeriousBroker.it  This test is not yet approved or cleared by the [place]  FDA and has been authorized for detection and/or diagnosis of SARS-CoV-2 by FDA under an Emergency Use Authorization (EUA). This EUA will remain in effect (meaning this test can be used) for the duration of the COVID-19 declaration under Section 564(b)(1) of the Act, 21 U.S.C. section 360bbb-3(b)(1), unless the authorization is terminated or revoked.     Resp Syncytial Virus by PCR NEGATIVE NEGATIVE Final    Comment: (NOTE) Fact Sheet for Patients: BloggerCourse.com  Fact Sheet for Healthcare  Providers: SeriousBroker.it  This test is not yet approved or cleared by the [place]  FDA and has been authorized for detection and/or diagnosis of SARS-CoV-2 by FDA under an Emergency Use Authorization (EUA). This EUA will remain in effect (meaning this test can be used) for the duration of the COVID-19 declaration under Section 564(b)(1) of the Act, 21 U.S.C. section 360bbb-3(b)(1), unless the authorization is terminated or revoked.  Performed at [facility], [address], [place], [place] [address]   Blood culture (routine x 2)     Status: None (Preliminary result)   Collection Time: 05/09/23  8:54 PM   Specimen: BLOOD LEFT ARM  Result Value Ref Range Status   Specimen Description   Final    BLOOD LEFT ARM Performed at [facility], [address], [place], [place] [address]    Special Requests   Final    BOTTLES DRAWN AEROBIC AND ANAEROBIC Blood Culture adequate volume Performed at [facility], [address] [place], [place] [address]    Culture   Final    NO GROWTH 3 DAYS Performed at [facility], [address], [place], [place] [address]    Report Status PENDING  Incomplete         Radiology Studies: No results found.       Scheduled Meds:  acetaminophen   1,000 mg Oral Q8H   amLODipine   5 mg Oral Daily   enoxaparin  (LOVENOX ) injection  40 mg Subcutaneous Daily   hydrochlorothiazide   12.5 mg Oral Daily   sodium chloride  flush  3 mL Intravenous Q12H   Continuous Infusions:  cefTRIAXone  (ROCEPHIN )  IV 2 g (05/11/23 1823)   metronidazole  500 mg (05/12/23 1120)     LOS: 3 days    Time spent: over 30 min    Donnetta Gains, MD [facility]   To contact the attending provider between 7A-7P or the covering provider during after hours 7P-7A, please log into the web site [email or web address] and access using universal [place] password for that web site. If you do  not have the password, please call the hospital operator.  05/12/2023, 11:31 AM

## 2023-05-12 NOTE — Progress Notes (Signed)
 I met with Glenn Dorsey to provide emotional and spiritual support.  He requested prayer that the antibiotics would do what was needed.  He very much does not want to have an ostomy. He has good family support and wants to get well for his kids.  His faith is very important to him and he expressed appreciation for the prayer.

## 2023-05-13 DIAGNOSIS — K5792 Diverticulitis of intestine, part unspecified, without perforation or abscess without bleeding: Secondary | ICD-10-CM | POA: Diagnosis not present

## 2023-05-13 LAB — COMPREHENSIVE METABOLIC PANEL WITH GFR
ALT: 47 U/L — ABNORMAL HIGH (ref 0–44)
AST: 31 U/L (ref 15–41)
Albumin: 3.1 g/dL — ABNORMAL LOW (ref 3.5–5.0)
Alkaline Phosphatase: 104 U/L (ref 38–126)
Anion gap: 12 (ref 5–15)
BUN: 9 mg/dL (ref 6–20)
CO2: 26 mmol/L (ref 22–32)
Calcium: 9 mg/dL (ref 8.9–10.3)
Chloride: 98 mmol/L (ref 98–111)
Creatinine, Ser: 0.65 mg/dL (ref 0.61–1.24)
GFR, Estimated: >60 mL/min (ref >60)
Glucose, Bld: 112 mg/dL — ABNORMAL HIGH (ref 70–99)
Potassium: 3.3 mmol/L — ABNORMAL LOW (ref 3.5–5.1)
Sodium: 136 mmol/L (ref 135–145)
Total Bilirubin: 0.5 mg/dL (ref 0.0–1.2)
Total Protein: 7.6 g/dL (ref 6.5–8.1)

## 2023-05-13 LAB — CBC WITH DIFFERENTIAL/PLATELET
Abs Immature Granulocytes: 0.27 10*3/uL — ABNORMAL HIGH (ref 0.00–0.07)
Basophils Absolute: 0.1 10*3/uL (ref 0.0–0.1)
Basophils Relative: 1 %
Eosinophils Absolute: 0.6 10*3/uL — ABNORMAL HIGH (ref 0.0–0.5)
Eosinophils Relative: 3 %
HCT: 45.1 % (ref 39.0–52.0)
Hemoglobin: 13.5 g/dL (ref 13.0–17.0)
Immature Granulocytes: 1 %
Lymphocytes Relative: 14 %
Lymphs Abs: 2.9 10*3/uL (ref 0.7–4.0)
MCH: 26.2 pg (ref 26.0–34.0)
MCHC: 29.9 g/dL — ABNORMAL LOW (ref 30.0–36.0)
MCV: 87.4 fL (ref 80.0–100.0)
Monocytes Absolute: 1.5 10*3/uL — ABNORMAL HIGH (ref 0.1–1.0)
Monocytes Relative: 7 %
Neutro Abs: 14.7 10*3/uL — ABNORMAL HIGH (ref 1.7–7.7)
Neutrophils Relative %: 74 %
Platelets: 421 10*3/uL — ABNORMAL HIGH (ref 150–400)
RBC: 5.16 MIL/uL (ref 4.22–5.81)
RDW: 13 % (ref 11.5–15.5)
WBC: 20 10*3/uL — ABNORMAL HIGH (ref 4.0–10.5)
nRBC: 0 % (ref 0.0–0.2)

## 2023-05-13 LAB — PHOSPHORUS: Phosphorus: 5.4 mg/dL — ABNORMAL HIGH (ref 2.5–4.6)

## 2023-05-13 LAB — MAGNESIUM: Magnesium: 2.5 mg/dL — ABNORMAL HIGH (ref 1.7–2.4)

## 2023-05-13 MED ORDER — POTASSIUM CHLORIDE CRYS ER 20 MEQ PO TBCR
40.0000 meq | EXTENDED_RELEASE_TABLET | ORAL | Status: AC
  Administered 2023-05-13 (×2): 40 meq via ORAL
  Filled 2023-05-13 (×2): qty 2

## 2023-05-13 NOTE — Progress Notes (Signed)
 PROGRESS NOTE    Glenn Dorsey  WUJ:[id number] DOB: 1985/06/16 DOA: 05/09/2023 PCP: Glenn Boos, FNP  Chief Complaint  Patient presents with   Abdominal Pain    Brief Narrative:   38 y.o. male with medical history significant for obesity who presented to the ED for evaluation of lower abdominal pain.  CT findings concerning for complicated diverticulitis.   Assessment & Plan:   Principal Problem:   Sepsis (HCC) Active Problems:   Abscess of sigmoid colon due to diverticulitis   Hypokalemia   Diverticulitis  Sepsis  Complicated Diverticulitis with Abscess Met criteria for sepsis with fever, tachycardia, leukocytosis and diverticulitis with abscess CT with diverticulitis of the sigmoid colon with pericolonic stranding and abscess measuring 3.1 cm in diameter Appreciate surgery assistance, trial fulls - no emergent surgical needs - repeat CT per surgery (5/4)--- needs outpatient colonoscopy in 6 weeks  Leukocytosis mildly worse today, will trend Appreciate IR assistance, area in question too small for drain placement and surrounded by loops of bowel -> recommending abx and follow up imaging in Glenn Dorsey few days  Severe Asymptomatic Hypertension Glenn Dorsey component likely related to pain  Ensure cuff appropriately sized (borderline, but appropriate when I checked) Amlodipine  and hydrochlorothiazide  started here. Continue prn labetalol   Adjust prn   Hypokalemia Follow  Obesity Body mass index is 50.04 kg/m.    DVT prophylaxis: SCD Code Status: full Family Communication: none Disposition:   Status is: Inpatient Remains inpatient appropriate because: need for ongoing inpt care   Consultants:  Surgery IR  Procedures:  none  Antimicrobials:  Anti-infectives (From admission, onward)    Start     Dose/Rate Route Frequency Ordered Stop   05/09/23 2230  metroNIDAZOLE  (FLAGYL ) IVPB 500 mg        500 mg 100 mL/hr over 60 Minutes Intravenous Every 12 hours 05/09/23 2220      05/09/23 1745  cefTAZidime (FORTAZ) 2 g in sodium chloride  0.9 % 100 mL IVPB  Status:  Discontinued        2 g 200 mL/hr over 30 Minutes Intravenous  Once 05/09/23 1735 05/09/23 1735   05/09/23 1745  cefTRIAXone  (ROCEPHIN ) 2 g in sodium chloride  0.9 % 100 mL IVPB        2 g 200 mL/hr over 30 Minutes Intravenous Every 24 hours 05/09/23 1736         Subjective: No new complaints  Objective: Vitals:   05/13/23 0300 05/13/23 0400 05/13/23 0500 05/13/23 0523  BP:  (!) 170/106  (!) 153/113  Pulse:  93  (!) 110  Resp: 18 19 (!) 21 20  Temp:    98.4 F (36.9 C)  TempSrc:    Oral  SpO2:    96%  Weight:      Height:        Intake/Output Summary (Last 24 hours) at 05/13/2023 1513 Last data filed at 05/13/2023 0400 Gross per 24 hour  Intake 1465 ml  Output --  Net 1465 ml   Filed Weights   05/09/23 1644  Weight: (!) 140.6 kg    Examination:  General: No acute distress. Cardiovascular: RRR Lungs: unlabored Abdomen: Soft, nontender, nondistended  Neurological: Alert and oriented 3. Moves all extremities 4 with equal strength. Cranial nerves II through XII grossly intact. Extremities: No clubbing or cyanosis. No edema.  Data Reviewed: I have personally reviewed following labs and imaging studies  CBC: Recent Labs  Lab 05/09/23 1841 05/10/23 0402 05/11/23 0401 05/12/23 0636 05/13/23 0446  WBC 19.4* 18.5*  20.4* 18.2* 20.0*  NEUTROABS 15.6*  --  16.7* 14.3* 14.7*  HGB 13.9 11.9* 12.7* 13.3 13.5  HCT 44.4 38.3* 40.4 43.3 45.1  MCV 83.5 84.7 85.6 86.4 87.4  PLT 340 309 336 389 421*    Basic Metabolic Panel: Recent Labs  Lab 05/09/23 1841 05/10/23 0402 05/11/23 0401 05/12/23 0636 05/13/23 0446  NA 135 137 135 136 136  K 3.1* 3.2* 3.7 3.4* 3.3*  CL 96* 99 95* 98 98  CO2 27 24 24 26 26   GLUCOSE 117* 141* 137* 134* 112*  BUN 7 6 6 7 9   CREATININE 0.52* 0.57* 0.83 0.71 0.65  CALCIUM 9.1 8.5* 8.7* 8.9 9.0  MG  --  1.8 2.4 2.3 2.5*  PHOS  --   --  4.8* 4.2  5.4*    GFR: Estimated Creatinine Clearance: 170.6 mL/min (by C-G formula based on SCr of 0.65 mg/dL).  Liver Function Tests: Recent Labs  Lab 05/09/23 1841 05/10/23 0402 05/11/23 0401 05/12/23 0636 05/13/23 0446  AST 28 16 17 24 31   ALT 83* 58* 47* 45* 47*  ALKPHOS 100 83 91 101 104  BILITOT 1.1 1.1 0.7 0.8 0.5  PROT 8.3* 6.9 7.5 7.7 7.6  ALBUMIN 3.7 2.9* 3.0* 3.0* 3.1*    CBG: No results for input(s): "GLUCAP" in the last 168 hours.   Recent Results (from the past 240 hours)  Blood culture (routine x 2)     Status: None (Preliminary result)   Collection Time: 05/09/23  6:41 PM   Specimen: BLOOD  Result Value Ref Range Status   Specimen Description   Final    BLOOD RIGHT ANTECUBITAL Performed at [facility], [address] [place], [place] [address]    Special Requests   Final    BOTTLES DRAWN AEROBIC AND ANAEROBIC Blood Culture adequate volume Performed at [facility] [address], [place], [place] [address]    Culture   Final    NO GROWTH 4 DAYS Performed at [facility], [address] [place], [place] [address]    Report Status PENDING  Incomplete  Resp panel by RT-PCR (RSV, Flu Glenn Dorsey&B, Covid) Anterior Nasal Swab     Status: None   Collection Time: 05/09/23  6:48 PM   Specimen: Anterior Nasal Swab  Result Value Ref Range Status   SARS Coronavirus 2 by RT PCR NEGATIVE NEGATIVE Final    Comment: (NOTE) SARS-CoV-2 target nucleic acids are NOT DETECTED.  The SARS-CoV-2 RNA is generally detectable in upper respiratory specimens during the acute phase of infection. The lowest concentration of SARS-CoV-2 viral copies this assay can detect is 138 copies/mL. Glenn Dorsey negative result does not preclude SARS-Cov-2 infection and should not be used as the sole basis for treatment or other patient management decisions. Glenn Dorsey negative result may occur with  improper specimen collection/handling, submission of specimen other than  nasopharyngeal swab, presence of viral mutation(s) within the areas targeted by this assay, and inadequate number of viral copies(<138 copies/mL). Glenn Dorsey negative result must be combined with clinical observations, patient history, and epidemiological information. The expected result is Negative.  Fact Sheet for Patients:  BloggerCourse.com  Fact Sheet for Healthcare Providers:  SeriousBroker.it  This test is no t yet approved or cleared by the [place]  FDA and  has been authorized for detection and/or diagnosis of SARS-CoV-2 by FDA under an Emergency Use Authorization (EUA). This EUA will remain  in effect (meaning this test can be used) for the duration  of the COVID-19 declaration under Section 564(b)(1) of the Act, 21 U.S.C.section 360bbb-3(b)(1), unless the authorization is terminated  or revoked sooner.       Influenza Viha Kriegel by PCR NEGATIVE NEGATIVE Final   Influenza B by PCR NEGATIVE NEGATIVE Final    Comment: (NOTE) The Xpert Xpress SARS-CoV-2/FLU/RSV plus assay is intended as an aid in the diagnosis of influenza from Nasopharyngeal swab specimens and should not be used as Karalyne Nusser sole basis for treatment. Nasal washings and aspirates are unacceptable for Xpert Xpress SARS-CoV-2/FLU/RSV testing.  Fact Sheet for Patients: BloggerCourse.com  Fact Sheet for Healthcare Providers: SeriousBroker.it  This test is not yet approved or cleared by the [place]  FDA and has been authorized for detection and/or diagnosis of SARS-CoV-2 by FDA under an Emergency Use Authorization (EUA). This EUA will remain in effect (meaning this test can be used) for the duration of the COVID-19 declaration under Section 564(b)(1) of the Act, 21 U.S.C. section 360bbb-3(b)(1), unless the authorization is terminated or revoked.     Resp Syncytial Virus by PCR NEGATIVE NEGATIVE Final    Comment:  (NOTE) Fact Sheet for Patients: BloggerCourse.com  Fact Sheet for Healthcare Providers: SeriousBroker.it  This test is not yet approved or cleared by the [place]  FDA and has been authorized for detection and/or diagnosis of SARS-CoV-2 by FDA under an Emergency Use Authorization (EUA). This EUA will remain in effect (meaning this test can be used) for the duration of the COVID-19 declaration under Section 564(b)(1) of the Act, 21 U.S.C. section 360bbb-3(b)(1), unless the authorization is terminated or revoked.  Performed at [facility], [address] [place], [place] [address]   Blood culture (routine x 2)     Status: None (Preliminary result)   Collection Time: 05/09/23  8:54 PM   Specimen: BLOOD LEFT ARM  Result Value Ref Range Status   Specimen Description   Final    BLOOD LEFT ARM Performed at [facility], [address] [place], [place] [address]    Special Requests   Final    BOTTLES DRAWN AEROBIC AND ANAEROBIC Blood Culture adequate volume Performed at [facility], [address] [place], [place] [address]    Culture   Final    NO GROWTH 4 DAYS Performed at [facility], [address], [place], [place] [address]    Report Status PENDING  Incomplete         Radiology Studies: No results found.       Scheduled Meds:  amLODipine   5 mg Oral Daily   enoxaparin  (LOVENOX ) injection  40 mg Subcutaneous Daily   feeding supplement (KATE FARMS STANDARD ENT 1.4)  325 mL Oral BID BM   hydrochlorothiazide   12.5 mg Oral Daily   sodium chloride  flush  3 mL Intravenous Q12H   Continuous Infusions:  cefTRIAXone  (ROCEPHIN )  IV 2 g (05/12/23 1815)   metronidazole  500 mg (05/13/23 1223)     LOS: 4 days    Time spent: over 30 min    Donnetta Gains, MD [facility]   To contact the attending provider between 7A-7P or the covering provider  during after hours 7P-7A, please log into the web site [email or web address] and access using universal [place] password for that web site. If you do not have the password, please call the hospital operator.  05/13/2023, 3:13 PM

## 2023-05-13 NOTE — Progress Notes (Signed)
   Subjective/Chief Complaint: Resting comfortably Reports pain is slightly improved Tolerating clears without nausea or vomiting Small BM yesterday    Objective: Vital signs in last 24 hours: Temp:  [98.4 F (36.9 C)-100.1 F (37.8 C)] 98.4 F (36.9 C) (05/03 0523) Pulse Rate:  [93-110] 110 (05/03 0523) Resp:  [14-26] 20 (05/03 0523) BP: (153-197)/(75-116) 153/113 (05/03 0523) SpO2:  [96 %-98 %] 96 % (05/03 0523) Last BM Date : 05/12/23  Intake/Output from previous day: 05/02 0701 - 05/03 0700 In: 1468 [P.O.:840; I.V.:3; NG/GT:325; IV Piggyback:300] Out: -  Intake/Output this shift: No intake/output data recorded.  Abd - obese, mild LLQ tenderness  Lab Results:  Recent Labs    05/12/23 0636 05/13/23 0446  WBC 18.2* 20.0*  HGB 13.3 13.5  HCT 43.3 45.1  PLT 389 421*   BMET Recent Labs    05/12/23 0636 05/13/23 0446  NA 136 136  K 3.4* 3.3*  CL 98 98  CO2 26 26  GLUCOSE 134* 112*  BUN 7 9  CREATININE 0.71 0.65  CALCIUM 8.9 9.0   PT/INR No results for input(s): "LABPROT", "INR" in the last 72 hours. ABG No results for input(s): "PHART", "HCO3" in the last 72 hours.  Invalid input(s): "PCO2", "PO2"  Studies/Results: No results found.  Anti-infectives: Anti-infectives (From admission, onward)    Start     Dose/Rate Route Frequency Ordered Stop   05/09/23 2230  metroNIDAZOLE  (FLAGYL ) IVPB 500 mg        500 mg 100 mL/hr over 60 Minutes Intravenous Every 12 hours 05/09/23 2220     05/09/23 1745  cefTAZidime (FORTAZ) 2 g in sodium chloride  0.9 % 100 mL IVPB  Status:  Discontinued        2 g 200 mL/hr over 30 Minutes Intravenous  Once 05/09/23 1735 05/09/23 1735   05/09/23 1745  cefTRIAXone  (ROCEPHIN ) 2 g in sodium chloride  0.9 % 100 mL IVPB        2 g 200 mL/hr over 30 Minutes Intravenous Every 24 hours 05/09/23 1736         Assessment/Plan: Sigmoid diverticulitis with 3.0 cm gas/fluid collection - afebrile, hemodynamically stable  - WBC  back up to 20 from 18 - IR reviewed CT and has no safe window for percutaneous drainage of abscess  - continue IV abx - no emergent surgical needs - while he is not clinically worsening, he is not really progressing either. Will repeat CT scan 05/14/23.  If no improvement or if abscess is worsening, will need to discuss urgent surgery which may include sigmoid colectomy with temporary colostomy.     FEN -FLD/ensure VTE - SCD's, Lovenox  ID - Rocephin /Flagyl   LOS: 4 days    Rella Cardinal 05/13/2023

## 2023-05-14 ENCOUNTER — Inpatient Hospital Stay (HOSPITAL_COMMUNITY)

## 2023-05-14 DIAGNOSIS — K5792 Diverticulitis of intestine, part unspecified, without perforation or abscess without bleeding: Secondary | ICD-10-CM | POA: Diagnosis not present

## 2023-05-14 LAB — CBC WITH DIFFERENTIAL/PLATELET
Abs Immature Granulocytes: 0.32 10*3/uL — ABNORMAL HIGH (ref 0.00–0.07)
Basophils Absolute: 0.1 10*3/uL (ref 0.0–0.1)
Basophils Relative: 1 %
Eosinophils Absolute: 0.6 10*3/uL — ABNORMAL HIGH (ref 0.0–0.5)
Eosinophils Relative: 3 %
HCT: 44.3 % (ref 39.0–52.0)
Hemoglobin: 13.3 g/dL (ref 13.0–17.0)
Immature Granulocytes: 2 %
Lymphocytes Relative: 12 %
Lymphs Abs: 2.4 10*3/uL (ref 0.7–4.0)
MCH: 26.2 pg (ref 26.0–34.0)
MCHC: 30 g/dL (ref 30.0–36.0)
MCV: 87.4 fL (ref 80.0–100.0)
Monocytes Absolute: 1.4 10*3/uL — ABNORMAL HIGH (ref 0.1–1.0)
Monocytes Relative: 7 %
Neutro Abs: 14.9 10*3/uL — ABNORMAL HIGH (ref 1.7–7.7)
Neutrophils Relative %: 75 %
Platelets: 453 10*3/uL — ABNORMAL HIGH (ref 150–400)
RBC: 5.07 MIL/uL (ref 4.22–5.81)
RDW: 13 % (ref 11.5–15.5)
WBC: 19.7 10*3/uL — ABNORMAL HIGH (ref 4.0–10.5)
nRBC: 0 % (ref 0.0–0.2)

## 2023-05-14 LAB — COMPREHENSIVE METABOLIC PANEL WITH GFR
ALT: 40 U/L (ref 0–44)
AST: 21 U/L (ref 15–41)
Albumin: 3 g/dL — ABNORMAL LOW (ref 3.5–5.0)
Alkaline Phosphatase: 93 U/L (ref 38–126)
Anion gap: 10 (ref 5–15)
BUN: 9 mg/dL (ref 6–20)
CO2: 28 mmol/L (ref 22–32)
Calcium: 9 mg/dL (ref 8.9–10.3)
Chloride: 100 mmol/L (ref 98–111)
Creatinine, Ser: 0.67 mg/dL (ref 0.61–1.24)
GFR, Estimated: >60 mL/min (ref >60)
Glucose, Bld: 141 mg/dL — ABNORMAL HIGH (ref 70–99)
Potassium: 3.5 mmol/L (ref 3.5–5.1)
Sodium: 138 mmol/L (ref 135–145)
Total Bilirubin: 0.7 mg/dL (ref 0.0–1.2)
Total Protein: 7.5 g/dL (ref 6.5–8.1)

## 2023-05-14 LAB — CULTURE, BLOOD (ROUTINE X 2)
Culture: NO GROWTH
Culture: NO GROWTH
Special Requests: ADEQUATE
Special Requests: ADEQUATE

## 2023-05-14 LAB — PHOSPHORUS: Phosphorus: 4.5 mg/dL (ref 2.5–4.6)

## 2023-05-14 LAB — MAGNESIUM: Magnesium: 2.4 mg/dL (ref 1.7–2.4)

## 2023-05-14 MED ORDER — IOHEXOL 300 MG/ML  SOLN
100.0000 mL | Freq: Once | INTRAMUSCULAR | Status: AC | PRN
  Administered 2023-05-14: 100 mL via INTRAVENOUS

## 2023-05-14 NOTE — Progress Notes (Signed)
   Subjective/Chief Complaint: Reports less abdominal pain Passing flatus No nausea or vomiting WBC down slightly   Objective: Vital signs in last 24 hours: Temp:  [98.1 F (36.7 C)-99.1 F (37.3 C)] 98.1 F (36.7 C) (05/04 0620) Pulse Rate:  [101] 101 (05/04 0620) Resp:  [16] 16 (05/04 0620) BP: (142-154)/(89-113) 142/89 (05/04 0934) SpO2:  [96 %-98 %] 96 % (05/04 0620) Last BM Date : 05/12/23  Intake/Output from previous day: 05/03 0701 - 05/04 0700 In: 1656 [P.O.:1360; IV Piggyback:296] Out: -  Intake/Output this shift: No intake/output data recorded.  WDWN in NAd Abd - soft, obese, mild LLQ tenderness  Lab Results:  Recent Labs    05/13/23 0446 05/14/23 0404  WBC 20.0* 19.7*  HGB 13.5 13.3  HCT 45.1 44.3  PLT 421* 453*   BMET Recent Labs    05/13/23 0446 05/14/23 0404  NA 136 138  K 3.3* 3.5  CL 98 100  CO2 26 28  GLUCOSE 112* 141*  BUN 9 9  CREATININE 0.65 0.67  CALCIUM 9.0 9.0    Anti-infectives: Anti-infectives (From admission, onward)    Start     Dose/Rate Route Frequency Ordered Stop   05/09/23 2230  metroNIDAZOLE  (FLAGYL ) IVPB 500 mg        500 mg 100 mL/hr over 60 Minutes Intravenous Every 12 hours 05/09/23 2220     05/09/23 1745  cefTAZidime (FORTAZ) 2 g in sodium chloride  0.9 % 100 mL IVPB  Status:  Discontinued        2 g 200 mL/hr over 30 Minutes Intravenous  Once 05/09/23 1735 05/09/23 1735   05/09/23 1745  cefTRIAXone  (ROCEPHIN ) 2 g in sodium chloride  0.9 % 100 mL IVPB        2 g 200 mL/hr over 30 Minutes Intravenous Every 24 hours 05/09/23 1736         Assessment/Plan: Sigmoid diverticulitis with 3.0 cm gas/fluid collection - afebrile, hemodynamically stable  - WBC 19.7 - IR reviewed CT and has no safe window for percutaneous drainage of abscess  - continue IV abx - no emergent surgical needs - while he is not clinically worsening, he is not really progressing either. Will repeat CT scan today.  If no improvement or if  abscess is worsening, will need to discuss urgent surgery which may include sigmoid colectomy with temporary colostomy.     FEN -FLD/ensure VTE - SCD's, Lovenox  ID - Rocephin /Flagyl   LOS: 5 days    Rella Cardinal 05/14/2023

## 2023-05-14 NOTE — Progress Notes (Signed)
 First bottle of oral contrast given.

## 2023-05-14 NOTE — Progress Notes (Signed)
 PROGRESS NOTE    Glenn Dorsey  WUJ:[id number] DOB: September 09, 1985 DOA: 05/09/2023 PCP: Glenn Boos, FNP  Chief Complaint  Patient presents with   Abdominal Pain    Brief Narrative:   38 y.o. male with medical history significant for obesity who presented to the ED for evaluation of lower abdominal pain.  CT findings concerning for complicated diverticulitis.   Assessment & Plan:   Principal Problem:   Sepsis (HCC) Active Problems:   Abscess of sigmoid colon due to diverticulitis   Hypokalemia   Diverticulitis  Sepsis  Complicated Diverticulitis with Abscess Met criteria for sepsis with fever, tachycardia, leukocytosis and diverticulitis with abscess CT with diverticulitis of the sigmoid colon with pericolonic stranding and abscess measuring 3.1 cm in diameter Appreciate surgery assistance, trial fulls - no emergent surgical needs - repeat CT per surgery (5/4)--- needs outpatient colonoscopy in 6 weeks  Leukocytosis elevated and fluctuating  Appreciate IR assistance, area in question too small for drain placement and surrounded by loops of bowel -> recommending abx and follow up imaging in Glenn Dorsey few days  Severe Asymptomatic Hypertension Ensure cuff appropriately sized  Amlodipine  and hydrochlorothiazide  started here. Adjust prn   Hypokalemia Follow  Obesity Body mass index is 50.04 kg/m.    DVT prophylaxis: SCD Code Status: full Family Communication: none Disposition:   Status is: Inpatient Remains inpatient appropriate because: need for ongoing inpt care   Consultants:  Surgery IR  Procedures:  none  Antimicrobials:  Anti-infectives (From admission, onward)    Start     Dose/Rate Route Frequency Ordered Stop   05/09/23 2230  metroNIDAZOLE  (FLAGYL ) IVPB 500 mg        500 mg 100 mL/hr over 60 Minutes Intravenous Every 12 hours 05/09/23 2220     05/09/23 1745  cefTAZidime (FORTAZ) 2 g in sodium chloride  0.9 % 100 mL IVPB  Status:  Discontinued         2 g 200 mL/hr over 30 Minutes Intravenous  Once 05/09/23 1735 05/09/23 1735   05/09/23 1745  cefTRIAXone  (ROCEPHIN ) 2 g in sodium chloride  0.9 % 100 mL IVPB        2 g 200 mL/hr over 30 Minutes Intravenous Every 24 hours 05/09/23 1736         Subjective: No complaints  Objective: Vitals:   05/13/23 0523 05/13/23 2040 05/14/23 0620 05/14/23 0934  BP: (!) 153/113 (!) 150/100 (!) 154/113 (!) 142/89  Pulse: (!) 110  (!) 101   Resp: 20 16 16    Temp: 98.4 F (36.9 C) 99.1 F (37.3 C) 98.1 F (36.7 C)   TempSrc: Oral Oral Oral   SpO2: 96% 98% 96%   Weight:      Height:        Intake/Output Summary (Last 24 hours) at 05/14/2023 1036 Last data filed at 05/14/2023 0500 Gross per 24 hour  Intake 1656 ml  Output --  Net 1656 ml   Filed Weights   05/09/23 1644  Weight: (!) 140.6 kg    Examination:  General: No acute distress. Cardiovascular: RRR Lungs: unlabored Abdomen: Soft, nontender, nondistended Neurological: Alert and oriented 3. Moves all extremities 4 with equal strength. Cranial nerves II through XII grossly intact. Extremities: No clubbing or cyanosis. No edema.  Data Reviewed: I have personally reviewed following labs and imaging studies  CBC: Recent Labs  Lab 05/09/23 1841 05/10/23 0402 05/11/23 0401 05/12/23 0636 05/13/23 0446 05/14/23 0404  WBC 19.4* 18.5* 20.4* 18.2* 20.0* 19.7*  NEUTROABS 15.6*  --  16.7* 14.3* 14.7* 14.9*  HGB 13.9 11.9* 12.7* 13.3 13.5 13.3  HCT 44.4 38.3* 40.4 43.3 45.1 44.3  MCV 83.5 84.7 85.6 86.4 87.4 87.4  PLT 340 309 336 389 421* 453*    Basic Metabolic Panel: Recent Labs  Lab 05/10/23 0402 05/11/23 0401 05/12/23 0636 05/13/23 0446 05/14/23 0404  NA 137 135 136 136 138  K 3.2* 3.7 3.4* 3.3* 3.5  CL 99 95* 98 98 100  CO2 24 24 26 26 28   GLUCOSE 141* 137* 134* 112* 141*  BUN 6 6 7 9 9   CREATININE 0.57* 0.83 0.71 0.65 0.67  CALCIUM 8.5* 8.7* 8.9 9.0 9.0  MG 1.8 2.4 2.3 2.5* 2.4  PHOS  --  4.8* 4.2 5.4* 4.5     GFR: Estimated Creatinine Clearance: 170.6 mL/min (by C-G formula based on SCr of 0.67 mg/dL).  Liver Function Tests: Recent Labs  Lab 05/10/23 0402 05/11/23 0401 05/12/23 0636 05/13/23 0446 05/14/23 0404  AST 16 17 24 31 21   ALT 58* 47* 45* 47* 40  ALKPHOS 83 91 101 104 93  BILITOT 1.1 0.7 0.8 0.5 0.7  PROT 6.9 7.5 7.7 7.6 7.5  ALBUMIN 2.9* 3.0* 3.0* 3.1* 3.0*    CBG: No results for input(s): "GLUCAP" in the last 168 hours.   Recent Results (from the past 240 hours)  Blood culture (routine x 2)     Status: None   Collection Time: 05/09/23  6:41 PM   Specimen: BLOOD  Result Value Ref Range Status   Specimen Description   Final    BLOOD RIGHT ANTECUBITAL Performed at [facility], [address], [place], [place] [address]    Special Requests   Final    BOTTLES DRAWN AEROBIC AND ANAEROBIC Blood Culture adequate volume Performed at [facility], [address], [place], [place] [address]    Culture   Final    NO GROWTH 5 DAYS Performed at [facility], [address], [place], [place] [address]    Report Status 05/14/2023 FINAL  Final  Resp panel by RT-PCR (RSV, Flu Nalayah Hitt&B, Covid) Anterior Nasal Swab     Status: None   Collection Time: 05/09/23  6:48 PM   Specimen: Anterior Nasal Swab  Result Value Ref Range Status   SARS Coronavirus 2 by RT PCR NEGATIVE NEGATIVE Final    Comment: (NOTE) SARS-CoV-2 target nucleic acids are NOT DETECTED.  The SARS-CoV-2 RNA is generally detectable in upper respiratory specimens during the acute phase of infection. The lowest concentration of SARS-CoV-2 viral copies this assay can detect is 138 copies/mL. Glenn Dorsey negative result does not preclude SARS-Cov-2 infection and should not be used as the sole basis for treatment or other patient management decisions. Glenn Dorsey negative result may occur with  improper specimen collection/handling, submission of specimen other than nasopharyngeal swab,  presence of viral mutation(s) within the areas targeted by this assay, and inadequate number of viral copies(<138 copies/mL). Glenn Dorsey negative result must be combined with clinical observations, patient history, and epidemiological information. The expected result is Negative.  Fact Sheet for Patients:  BloggerCourse.com  Fact Sheet for Healthcare Providers:  SeriousBroker.it  This test is no t yet approved or cleared by the [place]  FDA and  has been authorized for detection and/or diagnosis of SARS-CoV-2 by FDA under an Emergency Use Authorization (EUA). This EUA will remain  in effect (meaning this test can be used) for the duration of the COVID-19 declaration under Section 564(b)(1) of the  Act, 21 U.S.C.section 360bbb-3(b)(1), unless the authorization is terminated  or revoked sooner.       Influenza Tregan Read by PCR NEGATIVE NEGATIVE Final   Influenza B by PCR NEGATIVE NEGATIVE Final    Comment: (NOTE) The Xpert Xpress SARS-CoV-2/FLU/RSV plus assay is intended as an aid in the diagnosis of influenza from Nasopharyngeal swab specimens and should not be used as Lena Fieldhouse sole basis for treatment. Nasal washings and aspirates are unacceptable for Xpert Xpress SARS-CoV-2/FLU/RSV testing.  Fact Sheet for Patients: BloggerCourse.com  Fact Sheet for Healthcare Providers: SeriousBroker.it  This test is not yet approved or cleared by the [place]  FDA and has been authorized for detection and/or diagnosis of SARS-CoV-2 by FDA under an Emergency Use Authorization (EUA). This EUA will remain in effect (meaning this test can be used) for the duration of the COVID-19 declaration under Section 564(b)(1) of the Act, 21 U.S.C. section 360bbb-3(b)(1), unless the authorization is terminated or revoked.     Resp Syncytial Virus by PCR NEGATIVE NEGATIVE Final    Comment: (NOTE) Fact Sheet for  Patients: BloggerCourse.com  Fact Sheet for Healthcare Providers: SeriousBroker.it  This test is not yet approved or cleared by the [place]  FDA and has been authorized for detection and/or diagnosis of SARS-CoV-2 by FDA under an Emergency Use Authorization (EUA). This EUA will remain in effect (meaning this test can be used) for the duration of the COVID-19 declaration under Section 564(b)(1) of the Act, 21 U.S.C. section 360bbb-3(b)(1), unless the authorization is terminated or revoked.  Performed at [facility], [address], [place], [place] [address]   Blood culture (routine x 2)     Status: None   Collection Time: 05/09/23  8:54 PM   Specimen: BLOOD LEFT ARM  Result Value Ref Range Status   Specimen Description   Final    BLOOD LEFT ARM Performed at [facility], [address] [place], [place] [address]    Special Requests   Final    BOTTLES DRAWN AEROBIC AND ANAEROBIC Blood Culture adequate volume Performed at [facility], [address], [place], [place] [address]    Culture   Final    NO GROWTH 5 DAYS Performed at [facility], [address], [place], [place] [address]    Report Status 05/14/2023 FINAL  Final         Radiology Studies: No results found.       Scheduled Meds:  amLODipine   5 mg Oral Daily   enoxaparin  (LOVENOX ) injection  40 mg Subcutaneous Daily   feeding supplement (KATE FARMS STANDARD ENT 1.4)  325 mL Oral BID BM   hydrochlorothiazide   12.5 mg Oral Daily   sodium chloride  flush  3 mL Intravenous Q12H   Continuous Infusions:  cefTRIAXone  (ROCEPHIN )  IV Stopped (05/13/23 2007)   metronidazole  500 mg (05/14/23 0945)     LOS: 5 days    Time spent: over 30 min    Donnetta Gains, MD [facility]   To contact the attending provider between 7A-7P or the covering provider during after hours 7P-7A, please log into  the web site [email or web address] and access using universal [place] password for that web site. If you do not have the password, please call the hospital operator.  05/14/2023, 10:36 AM

## 2023-05-14 NOTE — Plan of Care (Signed)
   Problem: Fluid Volume: Goal: Hemodynamic stability will improve Outcome: Progressing   Problem: Clinical Measurements: Goal: Diagnostic test results will improve Outcome: Progressing Goal: Signs and symptoms of infection will decrease Outcome: Progressing   Problem: Respiratory: Goal: Ability to maintain adequate ventilation will improve Outcome: Progressing

## 2023-05-15 DIAGNOSIS — K5792 Diverticulitis of intestine, part unspecified, without perforation or abscess without bleeding: Secondary | ICD-10-CM | POA: Diagnosis not present

## 2023-05-15 LAB — MAGNESIUM: Magnesium: 2.3 mg/dL (ref 1.7–2.4)

## 2023-05-15 LAB — COMPREHENSIVE METABOLIC PANEL WITH GFR
ALT: 37 U/L (ref 0–44)
AST: 21 U/L (ref 15–41)
Albumin: 3 g/dL — ABNORMAL LOW (ref 3.5–5.0)
Alkaline Phosphatase: 89 U/L (ref 38–126)
Anion gap: 13 (ref 5–15)
BUN: 9 mg/dL (ref 6–20)
CO2: 25 mmol/L (ref 22–32)
Calcium: 9.1 mg/dL (ref 8.9–10.3)
Chloride: 99 mmol/L (ref 98–111)
Creatinine, Ser: 0.77 mg/dL (ref 0.61–1.24)
GFR, Estimated: >60 mL/min (ref >60)
Glucose, Bld: 123 mg/dL — ABNORMAL HIGH (ref 70–99)
Potassium: 3.4 mmol/L — ABNORMAL LOW (ref 3.5–5.1)
Sodium: 137 mmol/L (ref 135–145)
Total Bilirubin: 0.6 mg/dL (ref 0.0–1.2)
Total Protein: 7.5 g/dL (ref 6.5–8.1)

## 2023-05-15 LAB — CBC WITH DIFFERENTIAL/PLATELET
Abs Immature Granulocytes: 0.3 10*3/uL — ABNORMAL HIGH (ref 0.00–0.07)
Basophils Absolute: 0.1 10*3/uL (ref 0.0–0.1)
Basophils Relative: 1 %
Eosinophils Absolute: 0.5 10*3/uL (ref 0.0–0.5)
Eosinophils Relative: 3 %
HCT: 45.5 % (ref 39.0–52.0)
Hemoglobin: 13.8 g/dL (ref 13.0–17.0)
Immature Granulocytes: 2 %
Lymphocytes Relative: 15 %
Lymphs Abs: 2.7 10*3/uL (ref 0.7–4.0)
MCH: 26.1 pg (ref 26.0–34.0)
MCHC: 30.3 g/dL (ref 30.0–36.0)
MCV: 86.2 fL (ref 80.0–100.0)
Monocytes Absolute: 1.1 10*3/uL — ABNORMAL HIGH (ref 0.1–1.0)
Monocytes Relative: 6 %
Neutro Abs: 13 10*3/uL — ABNORMAL HIGH (ref 1.7–7.7)
Neutrophils Relative %: 73 %
Platelets: 424 10*3/uL — ABNORMAL HIGH (ref 150–400)
RBC: 5.28 MIL/uL (ref 4.22–5.81)
RDW: 12.8 % (ref 11.5–15.5)
WBC: 17.7 10*3/uL — ABNORMAL HIGH (ref 4.0–10.5)
nRBC: 0 % (ref 0.0–0.2)

## 2023-05-15 LAB — PHOSPHORUS: Phosphorus: 4.9 mg/dL — ABNORMAL HIGH (ref 2.5–4.6)

## 2023-05-15 MED ORDER — ENOXAPARIN SODIUM 40 MG/0.4ML IJ SOSY: 40.0000 mg | PREFILLED_SYRINGE | Freq: Every day | INTRAMUSCULAR | Status: DC

## 2023-05-15 NOTE — Plan of Care (Signed)

## 2023-05-15 NOTE — Progress Notes (Addendum)
 PROGRESS NOTE    Glenn Dorsey  OZH:[id number] DOB: [birth year] DOA: 05/09/2023 PCP: Bari Boos, FNP  Chief Complaint  Patient presents with   Abdominal Pain    Brief Narrative:   38 y.o. male with medical history significant for obesity who presented to the ED for evaluation of lower abdominal pain.  CT findings concerning for complicated diverticulitis.   Assessment & Plan:   Principal Problem:   Sepsis (HCC) Active Problems:   Abscess of sigmoid colon due to diverticulitis   Hypokalemia   Diverticulitis  Sepsis  Complicated Diverticulitis with Abscess Met criteria for sepsis with fever, tachycardia, leukocytosis and diverticulitis with abscess CT with diverticulitis of the sigmoid colon with pericolonic stranding and abscess measuring 3.1 cm in diameter Appreciate surgery assistance, trial fulls - no emergent surgical needs - repeat CT per surgery (sigmoid colon diverticulitis with increase in size of pericolonic abscess 5.7 cm, mall foci of free air in mesentery at level of abscess), IR planning for drain 5/6--- needs outpatient colonoscopy in 6 weeks  Leukocytosis elevated and fluctuating  Appreciate IR assistance, area in question too small for drain placement and surrounded by loops of bowel -> recommending abx and follow up imaging in [place] few days  Severe Asymptomatic Hypertension Ensure cuff appropriately sized  Amlodipine  and hydrochlorothiazide  started here. Adjust prn   Hypokalemia Follow  Obesity Body mass index is 50.04 kg/m.    DVT prophylaxis: SCD Code Status: full Family Communication: none Disposition:   Status is: Inpatient Remains inpatient appropriate because: need for ongoing inpt care   Consultants:  Surgery IR  Procedures:  none  Antimicrobials:  Anti-infectives (From admission, onward)    Start     Dose/Rate Route Frequency Ordered Stop   05/09/23 2230  metroNIDAZOLE  (FLAGYL ) IVPB 500 mg        500 mg 100 mL/hr over 60  Minutes Intravenous Every 12 hours 05/09/23 2220     05/09/23 1745  cefTAZidime (FORTAZ) 2 g in sodium chloride  0.9 % 100 mL IVPB  Status:  Discontinued        2 g 200 mL/hr over 30 Minutes Intravenous  Once 05/09/23 1735 05/09/23 1735   05/09/23 1745  cefTRIAXone  (ROCEPHIN ) 2 g in sodium chloride  0.9 % 100 mL IVPB        2 g 200 mL/hr over 30 Minutes Intravenous Every 24 hours 05/09/23 1736         Subjective: No new complaints  Objective: Vitals:   05/14/23 0934 05/14/23 1319 05/14/23 2018 05/15/23 0608  BP: (!) 142/89 (!) 149/111 (!) 141/93 (!) 163/115  Pulse:  (!) 109 (!) 103 (!) 103  Resp:  18 18 19   Temp:  98.9 F (37.2 C) 98.4 F (36.9 C) 98.2 F (36.8 C)  TempSrc:      SpO2:  94% 98% 98%  Weight:      Height:        Intake/Output Summary (Last 24 hours) at 05/15/2023 1248 Last data filed at 05/15/2023 1016 Gross per 24 hour  Intake 960 ml  Output --  Net 960 ml   Filed Weights   05/09/23 1644  Weight: (!) 140.6 kg    Examination:  General: No acute distress. Cardiovascular: RRR Lungs: unlabored Abdomen: Soft, nontender, nondistended - protuberant Neurological: Alert and oriented 3. Moves all extremities 4 with equal strength. Cranial nerves II through XII grossly intact. Extremities: No clubbing or cyanosis. No edema.  Data Reviewed: I have personally reviewed following labs and imaging studies  CBC: Recent Labs  Lab 05/11/23 0401 05/12/23 0636 05/13/23 0446 05/14/23 0404 05/15/23 0409  WBC 20.4* 18.2* 20.0* 19.7* 17.7*  NEUTROABS 16.7* 14.3* 14.7* 14.9* 13.0*  HGB 12.7* 13.3 13.5 13.3 13.8  HCT 40.4 43.3 45.1 44.3 45.5  MCV 85.6 86.4 87.4 87.4 86.2  PLT 336 389 421* 453* 424*    Basic Metabolic Panel: Recent Labs  Lab 05/11/23 0401 05/12/23 0636 05/13/23 0446 05/14/23 0404 05/15/23 0409  NA 135 136 136 138 137  K 3.7 3.4* 3.3* 3.5 3.4*  CL 95* 98 98 100 99  CO2 24 26 26 28 25   GLUCOSE 137* 134* 112* 141* 123*  BUN 6 7 9 9 9    CREATININE 0.83 0.71 0.65 0.67 0.77  CALCIUM 8.7* 8.9 9.0 9.0 9.1  MG 2.4 2.3 2.5* 2.4 2.3  PHOS 4.8* 4.2 5.4* 4.5 4.9*    GFR: Estimated Creatinine Clearance: 170.6 mL/min (by C-G formula based on SCr of 0.77 mg/dL).  Liver Function Tests: Recent Labs  Lab 05/11/23 0401 05/12/23 0636 05/13/23 0446 05/14/23 0404 05/15/23 0409  AST 17 24 31 21 21   ALT 47* 45* 47* 40 37  ALKPHOS 91 101 104 93 89  BILITOT 0.7 0.8 0.5 0.7 0.6  PROT 7.5 7.7 7.6 7.5 7.5  ALBUMIN 3.0* 3.0* 3.1* 3.0* 3.0*    CBG: No results for input(s): "GLUCAP" in the last 168 hours.   Recent Results (from the past 240 hours)  Blood culture (routine x 2)     Status: None   Collection Time: 05/09/23  6:41 PM   Specimen: BLOOD  Result Value Ref Range Status   Specimen Description   Final    BLOOD RIGHT ANTECUBITAL Performed at [facility], [address] [place], [place] [address]    Special Requests   Final    BOTTLES DRAWN AEROBIC AND ANAEROBIC Blood Culture adequate volume Performed at [facility], [address] [place], [place] [address]    Culture   Final    NO GROWTH 5 DAYS Performed at [facility], [address] [place], [place] [address]    Report Status 05/14/2023 FINAL  Final  Resp panel by RT-PCR (RSV, Flu Rector Devonshire&B, Covid) Anterior Nasal Swab     Status: None   Collection Time: 05/09/23  6:48 PM   Specimen: Anterior Nasal Swab  Result Value Ref Range Status   SARS Coronavirus 2 by RT PCR NEGATIVE NEGATIVE Final    Comment: (NOTE) SARS-CoV-2 target nucleic acids are NOT DETECTED.  The SARS-CoV-2 RNA is generally detectable in upper respiratory specimens during the acute phase of infection. The lowest concentration of SARS-CoV-2 viral copies this assay can detect is 138 copies/mL. Walta Bellville negative result does not preclude SARS-Cov-2 infection and should not be used as the sole basis for treatment or other patient management decisions. Humberto Addo negative  result may occur with  improper specimen collection/handling, submission of specimen other than nasopharyngeal swab, presence of viral mutation(s) within the areas targeted by this assay, and inadequate number of viral copies(<138 copies/mL). Anmol Fleck negative result must be combined with clinical observations, patient history, and epidemiological information. The expected result is Negative.  Fact Sheet for Patients:  BloggerCourse.com  Fact Sheet for Healthcare Providers:  SeriousBroker.it  This test is no t yet approved or cleared by the [place]  FDA and  has been authorized for detection and/or diagnosis of SARS-CoV-2 by FDA under an Emergency Use Authorization (EUA). This EUA will remain  in  effect (meaning this test can be used) for the duration of the COVID-19 declaration under Section 564(b)(1) of the Act, 21 U.S.C.section 360bbb-3(b)(1), unless the authorization is terminated  or revoked sooner.       Influenza Yeiden Frenkel by PCR NEGATIVE NEGATIVE Final   Influenza B by PCR NEGATIVE NEGATIVE Final    Comment: (NOTE) The Xpert Xpress SARS-CoV-2/FLU/RSV plus assay is intended as an aid in the diagnosis of influenza from Nasopharyngeal swab specimens and should not be used as Shalin Linders sole basis for treatment. Nasal washings and aspirates are unacceptable for Xpert Xpress SARS-CoV-2/FLU/RSV testing.  Fact Sheet for Patients: BloggerCourse.com  Fact Sheet for Healthcare Providers: SeriousBroker.it  This test is not yet approved or cleared by the [place]  FDA and has been authorized for detection and/or diagnosis of SARS-CoV-2 by FDA under an Emergency Use Authorization (EUA). This EUA will remain in effect (meaning this test can be used) for the duration of the COVID-19 declaration under Section 564(b)(1) of the Act, 21 U.S.C. section 360bbb-3(b)(1), unless the authorization is  terminated or revoked.     Resp Syncytial Virus by PCR NEGATIVE NEGATIVE Final    Comment: (NOTE) Fact Sheet for Patients: BloggerCourse.com  Fact Sheet for Healthcare Providers: SeriousBroker.it  This test is not yet approved or cleared by the [place]  FDA and has been authorized for detection and/or diagnosis of SARS-CoV-2 by FDA under an Emergency Use Authorization (EUA). This EUA will remain in effect (meaning this test can be used) for the duration of the COVID-19 declaration under Section 564(b)(1) of the Act, 21 U.S.C. section 360bbb-3(b)(1), unless the authorization is terminated or revoked.  Performed at [facility], [address] [place], [place] [address]   Blood culture (routine x 2)     Status: None   Collection Time: 05/09/23  8:54 PM   Specimen: BLOOD LEFT ARM  Result Value Ref Range Status   Specimen Description   Final    BLOOD LEFT ARM Performed at [facility], [address], [place], [place] [address]    Special Requests   Final    BOTTLES DRAWN AEROBIC AND ANAEROBIC Blood Culture adequate volume Performed at [facility], [address] [place], [place] [address]    Culture   Final    NO GROWTH 5 DAYS Performed at [facility], [address], [place], [place] [address]    Report Status 05/14/2023 FINAL  Final         Radiology Studies: CT ABDOMEN PELVIS W CONTRAST Result Date: 05/14/2023 CLINICAL DATA:  Left lower quadrant pain EXAM: CT ABDOMEN AND PELVIS WITH CONTRAST TECHNIQUE: Multidetector CT imaging of the abdomen and pelvis was performed using the standard protocol following bolus administration of intravenous contrast. RADIATION DOSE REDUCTION: This exam was performed according to the departmental dose-optimization program which includes automated exposure control, adjustment of the mA and/or kV according to patient size and/or use of  iterative reconstruction technique. CONTRAST:  OMNIPAQUE  IOHEXOL  300 MG/ML  SOLN COMPARISON:  CT abdomen and pelvis 05/09/2023. FINDINGS: Lower chest: No acute abnormality. Hepatobiliary: No focal liver abnormality is seen. No gallstones, gallbladder wall thickening, or biliary dilatation. Pancreas: Unremarkable. No pancreatic ductal dilatation or surrounding inflammatory changes. Spleen: Normal in size without focal abnormality. Adrenals/Urinary Tract: The kidneys and adrenal glands are within normal limits. There is mild wall thickening and inflammatory stranding of the superior bladder wall. The bladder is otherwise within normal limits. Stomach/Bowel: Again seen is sigmoid  colon diverticulosis with wall thickening and surrounding inflammation compatible with acute diverticulitis. Adjacent to the mid sigmoid colon in the midline pelvis there is enhance sing fluid collection which has increased in size measuring 3.9 x 2.8 by 5.7 cm. There is marked inflammatory stranding surrounding this fluid collection. There also small foci of free air in the mesentery at this level. There is no bowel obstruction. The appendix, stomach, and small bowel loops appear within normal limits. Vascular/Lymphatic: No significant vascular findings are present. No enlarged abdominal or pelvic lymph nodes. Reproductive: Prostate is unremarkable. Other: No abdominal wall hernia or abnormality. No abdominopelvic ascites. Musculoskeletal: No acute or significant osseous findings. IMPRESSION: 1. Acute sigmoid colon diverticulitis with interval increase in size of pericolonic abscess measuring up to 5.7 cm. 2. Small foci of free air in the mesentery at the level of the abscess compatible with perforation. 3. Mild wall thickening and inflammatory stranding of the superior bladder wall may be reactive or related to cystitis. Electronically Signed   By: Tyron Gallon M.D.   On: 05/14/2023 18:19         Scheduled Meds:  amLODipine    5 mg Oral Daily   [START ON 05/17/2023] enoxaparin  (LOVENOX ) injection  40 mg Subcutaneous Daily   feeding supplement (KATE FARMS STANDARD ENT 1.4)  325 mL Oral BID BM   hydrochlorothiazide   12.5 mg Oral Daily   sodium chloride  flush  3 mL Intravenous Q12H   Continuous Infusions:  cefTRIAXone  (ROCEPHIN )  IV 2 g (05/14/23 1725)   metronidazole  500 mg (05/15/23 1016)     LOS: 6 days    Time spent: over 30 min    Donnetta Gains, MD [facility]   To contact the attending provider between 7A-7P or the covering provider during after hours 7P-7A, please log into the web site [email or web address] and access using universal [place] password for that web site. If you do not have the password, please call the hospital operator.  05/15/2023, 12:48 PM

## 2023-05-15 NOTE — Consult Note (Signed)
 .7  Chief Complaint: Lower abdominal pain/diverticular abscess; referred for image guided aspiration/possible drainage of diverticular abscess  Referring Provider(s): Maczis,M,PA-C  Supervising Physician: Erica Hau  Patient Status: [facility] - In-pt  History of Present Illness: Glenn Dorsey is a 38 y.o. male ex smoker  with PMH sig for asthma, prediabetes, obesity who was recently admitted to [facility] on 05/09/23 for evaluation of persistent lower abdominal pain. Also noted to be hypertensive. CT A/P at that time revealed:  1. Colonic diverticulosis. Acute diverticulitis of the sigmoid colon with pericolonic stranding and abscess measuring 3.1 cm diameter. 2. No evidence of bowel obstruction.  No free air.   CCS placed request for image guided abd abscess drain placement, but area in question was too small and surrounded by bowel loops; f/u CT A/P done yesterday revealed:   1. Acute sigmoid colon diverticulitis with interval increase in size of pericolonic abscess measuring up to 5.7 cm. 2. Small foci of free air in the mesentery at the level of the abscess compatible with perforation. 3. Mild wall thickening and inflammatory stranding of the superior bladder wall may be reactive or related to cystitis.   Pt is afebrile, WBC 17.7(19.7), hgb nl, plts 424k, creat nl, PT/INR pend; blood cx neg; pt on IV rocephin /flagyl ; request now placed again by CCS for image guided drainage of abd abscess.    Patient is Full Code  Past Medical History:  Diagnosis Date   Asthma    not since early 20's   Pre-diabetes     Past Surgical History:  Procedure Laterality Date   GANGLION CYST EXCISION Left 08/10/2022   Procedure: Excision of left volar carpal ganglion cyst;  Surgeon: Marilyn Shropshire, MD;  Location: [facility] OR;  Service: Orthopedics;  Laterality: Left;  regional 45   NO PAST SURGERIES      Allergies: Peanut butter flavoring agent (non-screening)  Medications: Prior to Admission  medications   Medication Sig Start Date End Date Taking? Authorizing Provider  albuterol (VENTOLIN HFA) 108 (90 Base) MCG/ACT inhaler Inhale 1-2 puffs into the lungs every 4 (four) hours as needed for wheezing or shortness of breath. 06/28/22  Yes [provider]  EPINEPHrine 0.3 mg/0.3 mL IJ SOAJ injection Inject 0.3 mg into the muscle as needed for anaphylaxis. 11/17/21  Yes [provider]  ibuprofen (ADVIL) 200 MG tablet Take 200-800 mg by mouth every 6 (six) hours as needed for moderate pain (pain score 4-6).   Yes [provider]  OVER THE COUNTER MEDICATION Take 1 tablet by mouth daily. Allergen Tablet   Yes [provider]  Pseudoephedrine-APAP-DM (DAYQUIL MULTI-SYMPTOM PO) Take 1 Dose by mouth every 4 (four) hours as needed (Cold/cough).   Yes [provider]     Family History  Problem Relation Age of Onset   Diabetes Mother    Diabetes Father     Social History   Socioeconomic History   Marital status: Single    Spouse name: Not on file   Number of children: Not on file   Years of education: Not on file   Highest education level: Not on file  Occupational History   Not on file  Tobacco Use   Smoking status: Former    Types: Cigarettes   Smokeless tobacco: Not on file  Vaping Use   Vaping status: Never Used  Substance and Sexual Activity   Alcohol use: No   Drug use: No   Sexual activity: Not on file  Other Topics Concern   Not  on file  Social History Narrative   Not on file   Social Drivers of Health   Financial Resource Strain: Not on file  Food Insecurity: No Food Insecurity (05/09/2023)   Hunger Vital Sign    Worried About Running Out of Food in the Last Year: Never true    Ran Out of Food in the Last Year: Never true  Transportation Needs: No Transportation Needs (05/09/2023)   PRAPARE - Administrator, Civil Service (Medical): No    Lack of Transportation (Non-Medical): No  Physical Activity: Not on  file  Stress: Not on file  Social Connections: Not on file       Review of Systems currently denies fever, headache, chest pain, dyspnea, cough, abdominal/back pain, nausea, vomiting or bleeding  Vital Signs: BP (!) 163/115 (BP Location: Right Arm)   Pulse (!) 103   Temp 98.2 F (36.8 C)   Resp 19   Ht 5\' 6"  (1.676 m)   Wt (!) 310 lb (140.6 kg)   SpO2 98%   BMI 50.04 kg/m   Advance Care Plan: no documents on file  Physical Exam awake, alert.  Chest clear to auscultation bilaterally.  Heart with slightly tachycardic but regular rhythm.  Abdomen obese, soft, positive bowel sounds, nontender.  Extremities with full range of motion.  Imaging: CT ABDOMEN PELVIS W CONTRAST Result Date: 05/14/2023 CLINICAL DATA:  Left lower quadrant pain EXAM: CT ABDOMEN AND PELVIS WITH CONTRAST TECHNIQUE: Multidetector CT imaging of the abdomen and pelvis was performed using the standard protocol following bolus administration of intravenous contrast. RADIATION DOSE REDUCTION: This exam was performed according to the departmental dose-optimization program which includes automated exposure control, adjustment of the mA and/or kV according to patient size and/or use of iterative reconstruction technique. CONTRAST:  OMNIPAQUE  IOHEXOL  300 MG/ML  SOLN COMPARISON:  CT abdomen and pelvis 05/09/2023. FINDINGS: Lower chest: No acute abnormality. Hepatobiliary: No focal liver abnormality is seen. No gallstones, gallbladder wall thickening, or biliary dilatation. Pancreas: Unremarkable. No pancreatic ductal dilatation or surrounding inflammatory changes. Spleen: Normal in size without focal abnormality. Adrenals/Urinary Tract: The kidneys and adrenal glands are within normal limits. There is mild wall thickening and inflammatory stranding of the superior bladder wall. The bladder is otherwise within normal limits. Stomach/Bowel: Again seen is sigmoid colon diverticulosis with wall thickening and surrounding  inflammation compatible with acute diverticulitis. Adjacent to the mid sigmoid colon in the midline pelvis there is enhance sing fluid collection which has increased in size measuring 3.9 x 2.8 by 5.7 cm. There is marked inflammatory stranding surrounding this fluid collection. There also small foci of free air in the mesentery at this level. There is no bowel obstruction. The appendix, stomach, and small bowel loops appear within normal limits. Vascular/Lymphatic: No significant vascular findings are present. No enlarged abdominal or pelvic lymph nodes. Reproductive: Prostate is unremarkable. Other: No abdominal wall hernia or abnormality. No abdominopelvic ascites. Musculoskeletal: No acute or significant osseous findings. IMPRESSION: 1. Acute sigmoid colon diverticulitis with interval increase in size of pericolonic abscess measuring up to 5.7 cm. 2. Small foci of free air in the mesentery at the level of the abscess compatible with perforation. 3. Mild wall thickening and inflammatory stranding of the superior bladder wall may be reactive or related to cystitis. Electronically Signed   By: Tyron Gallon M.D.   On: 05/14/2023 18:19   CT ABDOMEN PELVIS W CONTRAST Result Date: 05/09/2023 CLINICAL DATA:  Sepsis. Lower abdominal pain starting a few  days ago. Nausea. Loose stools. Dysuria for 2 days. EXAM: CT ABDOMEN AND PELVIS WITH CONTRAST TECHNIQUE: Multidetector CT imaging of the abdomen and pelvis was performed using the standard protocol following bolus administration of intravenous contrast. RADIATION DOSE REDUCTION: This exam was performed according to the departmental dose-optimization program which includes automated exposure control, adjustment of the mA and/or kV according to patient size and/or use of iterative reconstruction technique. CONTRAST:  OMNIPAQUE  IOHEXOL  300 MG/ML  SOLN COMPARISON:  Chest radiograph 05/09/2023 FINDINGS: Lower chest: Lung bases are clear. Hepatobiliary: Mild diffuse  fatty infiltration of the liver. No focal liver lesions. Gallbladder and bile ducts are normal. Pancreas: Unremarkable. No pancreatic ductal dilatation or surrounding inflammatory changes. Spleen: Normal in size without focal abnormality. Adrenals/Urinary Tract: Adrenal glands are unremarkable. Kidneys are normal, without renal calculi, focal lesion, or hydronephrosis. Bladder is unremarkable. Stomach/Bowel: Stomach, small bowel, and colon are mostly decompressed. Diverticulosis of the sigmoid colon. There is infiltration in the pelvic fat around the sigmoid colon. Within the sigmoid C-loop, there is an abscess with surrounding fat stranding. The abscess measures 3.1 cm diameter. Adjacent sigmoid colonic wall thickening. Changes are consistent with acute diverticulitis with pericolonic abscess. The appendix is normal. Vascular/Lymphatic: No significant vascular findings are present. No enlarged abdominal or pelvic lymph nodes. Reproductive: Prostate is unremarkable. Other: No free air in the abdomen. No free fluid. Abdominal wall musculature appears intact. Musculoskeletal: No acute or significant osseous findings. IMPRESSION: 1. Colonic diverticulosis. Acute diverticulitis of the sigmoid colon with pericolonic stranding and abscess measuring 3.1 cm diameter. 2. No evidence of bowel obstruction.  No free air. Electronically Signed   By: Boyce Byes M.D.   On: 05/09/2023 22:16   DG Chest Portable 1 View Result Date: 05/09/2023 CLINICAL DATA:  Lower abdominal pain.  Sepsis. EXAM: PORTABLE CHEST 1 VIEW COMPARISON:  February 21, 2013. FINDINGS: The heart size and mediastinal contours are within normal limits. Both lungs are clear. The visualized skeletal structures are unremarkable. IMPRESSION: No active disease. Electronically Signed   By: Rosalene Colon M.D.   On: 05/09/2023 17:56    Labs:  CBC: Recent Labs    05/12/23 0636 05/13/23 0446 05/14/23 0404 05/15/23 0409  WBC 18.2* 20.0* 19.7* 17.7*   HGB 13.3 13.5 13.3 13.8  HCT 43.3 45.1 44.3 45.5  PLT 389 421* 453* 424*    COAGS: No results for input(s): "INR", "APTT" in the last 8760 hours.  BMP: Recent Labs    05/12/23 0636 05/13/23 0446 05/14/23 0404 05/15/23 0409  NA 136 136 138 137  K 3.4* 3.3* 3.5 3.4*  CL 98 98 100 99  CO2 26 26 28 25   GLUCOSE 134* 112* 141* 123*  BUN 7 9 9 9   CALCIUM 8.9 9.0 9.0 9.1  CREATININE 0.71 0.65 0.67 0.77  GFRNONAA >60 >60 >60 >60    LIVER FUNCTION TESTS: Recent Labs    05/12/23 0636 05/13/23 0446 05/14/23 0404 05/15/23 0409  BILITOT 0.8 0.5 0.7 0.6  AST 24 31 21 21   ALT 45* 47* 40 37  ALKPHOS 101 104 93 89  PROT 7.7 7.6 7.5 7.5  ALBUMIN 3.0* 3.1* 3.0* 3.0*    TUMOR MARKERS: No results for input(s): "AFPTM", "CEA", "CA199", "CHROMGRNA" in the last 8760 hours.  Assessment and Plan: 38 y.o. male ex smoker with PMH sig for asthma, prediabetes, obesity who was recently admitted to [facility] on 05/09/23 for evaluation of persistent lower abdominal pain. Also noted to be hypertensive. CT A/P at that  time revealed:  1. Colonic diverticulosis. Acute diverticulitis of the sigmoid colon with pericolonic stranding and abscess measuring 3.1 cm diameter. 2. No evidence of bowel obstruction.  No free air.   CCS placed request for image guided abd abscess drain placement, but area in question was too small and surrounded by bowel loops; f/u CT A/P done yesterday revealed:   1. Acute sigmoid colon diverticulitis with interval increase in size of pericolonic abscess measuring up to 5.7 cm. 2. Small foci of free air in the mesentery at the level of the abscess compatible with perforation. 3. Mild wall thickening and inflammatory stranding of the superior bladder wall may be reactive or related to cystitis.   Pt is afebrile, WBC 17.7(19.7), hgb nl, plts 424k, creat nl, PT/INR pend; blood cx neg; pt on IV rocephin /flagyl ; request now placed again by CCS for image guided drainage of abd  abscess.  Latest imaging studies have been reviewed by Dr. Nereida Banning.Risks and benefits discussed with the patient including bleeding, infection, damage to adjacent structures, bowel perforation/fistula connection, inability to place drain or aspirate area of concern and sepsis.  All of the patient's questions were answered, patient is agreeable to proceed. Consent signed and in chart.  Procedure scheduled for 5/6    Thank you for allowing our service to participate in Denzil Palko 's care.  Electronically Signed: D. Honore Lux, PA-C   05/15/2023, 11:40 AM      I spent a total of 40 Minutes    in face to face in clinical consultation, greater than 50% of which was counseling/coordinating care for image guided aspiration/possible drainage of abdominal abscess

## 2023-05-15 NOTE — Progress Notes (Addendum)
 Subjective: CC: Feeling better. No abdominal pain since yesterday afternoon. Reports the last time he had abdominal pain was right before he had a bm and resolved after. Only has required prn tylenol  for pain over the last 24 hours. Tolerating fld without n/v - just finished eating. Formed, non-bloody bm yesterday.   Afebrile. HR 100's. No hypotension. WBC 17.7 (19.7). CT w/ sigmoid colon diverticulitis with interval increase in size of pericolonic abscess measuring up to 5.7 cm and small foci of free air in the mesentery at the level of the abscess compatible with perforation.  No personal or family hx of IBD or colon ca. No prior colonoscopy.   Objective: Vital signs in last 24 hours: Temp:  [98.2 F (36.8 C)-98.9 F (37.2 C)] 98.2 F (36.8 C) (05/05 0608) Pulse Rate:  [103-109] 103 (05/05 0608) Resp:  [18-19] 19 (05/05 0608) BP: (141-163)/(93-115) 163/115 (05/05 0608) SpO2:  [94 %-98 %] 98 % (05/05 0608) Last BM Date : 05/12/23  Intake/Output from previous day: 05/04 0701 - 05/05 0700 In: 1200 [P.O.:1200] Out: -  Intake/Output this shift: No intake/output data recorded.  PE: Gen:  Alert, NAD, pleasant Abd: Soft, ND, suprapubic ttp without rigidity or guarding and is otherwise NT. +BS Psych: A&Ox3  Lab Results:  Recent Labs    05/14/23 0404 05/15/23 0409  WBC 19.7* 17.7*  HGB 13.3 13.8  HCT 44.3 45.5  PLT 453* 424*   BMET Recent Labs    05/14/23 0404 05/15/23 0409  NA 138 137  K 3.5 3.4*  CL 100 99  CO2 28 25  GLUCOSE 141* 123*  BUN 9 9  CREATININE 0.67 0.77  CALCIUM 9.0 9.1   PT/INR No results for input(s): "LABPROT", "INR" in the last 72 hours. CMP     Component Value Date/Time   NA 137 05/15/2023 0409   K 3.4 (L) 05/15/2023 0409   CL 99 05/15/2023 0409   CO2 25 05/15/2023 0409   GLUCOSE 123 (H) 05/15/2023 0409   BUN 9 05/15/2023 0409   CREATININE 0.77 05/15/2023 0409   CALCIUM 9.1 05/15/2023 0409   PROT 7.5 05/15/2023 0409   ALBUMIN  3.0 (L) 05/15/2023 0409   AST 21 05/15/2023 0409   ALT 37 05/15/2023 0409   ALKPHOS 89 05/15/2023 0409   BILITOT 0.6 05/15/2023 0409   GFRNONAA >60 05/15/2023 0409   Lipase  No results found for: "LIPASE"  Studies/Results: CT ABDOMEN PELVIS W CONTRAST Result Date: 05/14/2023 CLINICAL DATA:  Left lower quadrant pain EXAM: CT ABDOMEN AND PELVIS WITH CONTRAST TECHNIQUE: Multidetector CT imaging of the abdomen and pelvis was performed using the standard protocol following bolus administration of intravenous contrast. RADIATION DOSE REDUCTION: This exam was performed according to the departmental dose-optimization program which includes automated exposure control, adjustment of the mA and/or kV according to patient size and/or use of iterative reconstruction technique. CONTRAST:  OMNIPAQUE  IOHEXOL  300 MG/ML  SOLN COMPARISON:  CT abdomen and pelvis 05/09/2023. FINDINGS: Lower chest: No acute abnormality. Hepatobiliary: No focal liver abnormality is seen. No gallstones, gallbladder wall thickening, or biliary dilatation. Pancreas: Unremarkable. No pancreatic ductal dilatation or surrounding inflammatory changes. Spleen: Normal in size without focal abnormality. Adrenals/Urinary Tract: The kidneys and adrenal glands are within normal limits. There is mild wall thickening and inflammatory stranding of the superior bladder wall. The bladder is otherwise within normal limits. Stomach/Bowel: Again seen is sigmoid colon diverticulosis with wall thickening and surrounding inflammation compatible with acute diverticulitis. Adjacent to  the mid sigmoid colon in the midline pelvis there is enhance sing fluid collection which has increased in size measuring 3.9 x 2.8 by 5.7 cm. There is marked inflammatory stranding surrounding this fluid collection. There also small foci of free air in the mesentery at this level. There is no bowel obstruction. The appendix, stomach, and small bowel loops appear within normal limits.  Vascular/Lymphatic: No significant vascular findings are present. No enlarged abdominal or pelvic lymph nodes. Reproductive: Prostate is unremarkable. Other: No abdominal wall hernia or abnormality. No abdominopelvic ascites. Musculoskeletal: No acute or significant osseous findings. IMPRESSION: 1. Acute sigmoid colon diverticulitis with interval increase in size of pericolonic abscess measuring up to 5.7 cm. 2. Small foci of free air in the mesentery at the level of the abscess compatible with perforation. 3. Mild wall thickening and inflammatory stranding of the superior bladder wall may be reactive or related to cystitis. Electronically Signed   By: Tyron Gallon M.D.   On: 05/14/2023 18:19    Anti-infectives: Anti-infectives (From admission, onward)    Start     Dose/Rate Route Frequency Ordered Stop   05/09/23 2230  metroNIDAZOLE  (FLAGYL ) IVPB 500 mg        500 mg 100 mL/hr over 60 Minutes Intravenous Every 12 hours 05/09/23 2220     05/09/23 1745  cefTAZidime (FORTAZ) 2 g in sodium chloride  0.9 % 100 mL IVPB  Status:  Discontinued        2 g 200 mL/hr over 30 Minutes Intravenous  Once 05/09/23 1735 05/09/23 1735   05/09/23 1745  cefTRIAXone  (ROCEPHIN ) 2 g in sodium chloride  0.9 % 100 mL IVPB        2 g 200 mL/hr over 30 Minutes Intravenous Every 24 hours 05/09/23 1736          Assessment/Plan Sigmoid diverticulitis w/ abscess - No current indication for emergency surgery - Cont IV abx - Repeat CT 5/4 w/ sigmoid colon diverticulitis with 5.7 pericolonic abscess and small foci of free air in the mesentery at the level of the abscess - IR consult for drainage. Fluid collection is central but appears there may be a window for drainage.  - Will leave on FLD today - Hopefully patient will improve with conservative treatment.  If patient fails to improve he may require surgical intervention resulting in a colectomy/colostomy.  This was discussed with the patient. - If patient improves  with conservative therapies would recommend colonoscopy in ~6-8 weeks.   - We will follow with you   FEN - FLD, NPO midnight.  VTE - SCDs, Lovenox  (hold 5/6 and obtain PT-INR incase IR able to proceed w/ drainage) ID - Rocephin /Flagyl   I reviewed nursing notes, hospitalist notes, last 24 h vitals and pain scores, last 48 h intake and output, last 24 h labs and trends, and last 24 h imaging results.   LOS: 6 days    Delton Filbert, [facility] 05/15/2023, 10:16 AM Please see Amion for pager number during day hours 7:00am-4:30pm

## 2023-05-16 DIAGNOSIS — K5792 Diverticulitis of intestine, part unspecified, without perforation or abscess without bleeding: Secondary | ICD-10-CM | POA: Diagnosis not present

## 2023-05-16 LAB — CBC WITH DIFFERENTIAL/PLATELET
Abs Immature Granulocytes: 0.34 10*3/uL — ABNORMAL HIGH (ref 0.00–0.07)
Basophils Absolute: 0.1 10*3/uL (ref 0.0–0.1)
Basophils Relative: 1 %
Eosinophils Absolute: 0.4 10*3/uL (ref 0.0–0.5)
Eosinophils Relative: 2 %
HCT: 42 % (ref 39.0–52.0)
Hemoglobin: 13.1 g/dL (ref 13.0–17.0)
Immature Granulocytes: 2 %
Lymphocytes Relative: 11 %
Lymphs Abs: 2.1 10*3/uL (ref 0.7–4.0)
MCH: 26.4 pg (ref 26.0–34.0)
MCHC: 31.2 g/dL (ref 30.0–36.0)
MCV: 84.7 fL (ref 80.0–100.0)
Monocytes Absolute: 1 10*3/uL (ref 0.1–1.0)
Monocytes Relative: 5 %
Neutro Abs: 15.2 10*3/uL — ABNORMAL HIGH (ref 1.7–7.7)
Neutrophils Relative %: 79 %
Platelets: 467 10*3/uL — ABNORMAL HIGH (ref 150–400)
RBC: 4.96 MIL/uL (ref 4.22–5.81)
RDW: 12.8 % (ref 11.5–15.5)
WBC: 19.1 10*3/uL — ABNORMAL HIGH (ref 4.0–10.5)
nRBC: 0 % (ref 0.0–0.2)

## 2023-05-16 LAB — HEMOGLOBIN A1C
Hgb A1c MFr Bld: 6.3 % — ABNORMAL HIGH (ref 4.8–5.6)
Mean Plasma Glucose: 134.11 mg/dL

## 2023-05-16 LAB — COMPREHENSIVE METABOLIC PANEL WITH GFR
ALT: 29 U/L (ref 0–44)
AST: 14 U/L — ABNORMAL LOW (ref 15–41)
Albumin: 3.2 g/dL — ABNORMAL LOW (ref 3.5–5.0)
Alkaline Phosphatase: 81 U/L (ref 38–126)
Anion gap: 12 (ref 5–15)
BUN: 10 mg/dL (ref 6–20)
CO2: 27 mmol/L (ref 22–32)
Calcium: 9.2 mg/dL (ref 8.9–10.3)
Chloride: 98 mmol/L (ref 98–111)
Creatinine, Ser: 0.72 mg/dL (ref 0.61–1.24)
GFR, Estimated: >60 mL/min (ref >60)
Glucose, Bld: 127 mg/dL — ABNORMAL HIGH (ref 70–99)
Potassium: 3.5 mmol/L (ref 3.5–5.1)
Sodium: 137 mmol/L (ref 135–145)
Total Bilirubin: 0.8 mg/dL (ref 0.0–1.2)
Total Protein: 7.8 g/dL (ref 6.5–8.1)

## 2023-05-16 LAB — PROTIME-INR
INR: 1.1 (ref 0.8–1.2)
Prothrombin Time: 14.5 s (ref 11.4–15.2)

## 2023-05-16 MED ORDER — ENOXAPARIN SODIUM 40 MG/0.4ML IJ SOSY
40.0000 mg | PREFILLED_SYRINGE | Freq: Every day | INTRAMUSCULAR | Status: DC
  Administered 2023-05-18 – 2023-05-19 (×2): 40 mg via SUBCUTANEOUS
  Filled 2023-05-16 (×2): qty 0.4

## 2023-05-16 NOTE — Progress Notes (Signed)
 Subjective: Patient doing well, minimal abdominal pain, tolerated FLD yesterday. Afebrile.  Objective: Vital signs in last 24 hours: Temp:  [98.5 F (36.9 C)-100 F (37.8 C)] 98.5 F (36.9 C) (05/06 0538) Pulse Rate:  [96-111] 97 (05/06 0538) Resp:  [16] 16 (05/06 0538) BP: (149-153)/(89-99) 149/89 (05/06 0538) SpO2:  [93 %-97 %] 93 % (05/06 0538) Last BM Date : 05/12/23  Intake/Output from previous day: 05/05 0701 - 05/06 0700 In: 860.1 [P.O.:360; IV Piggyback:500.1] Out: -  Intake/Output this shift: No intake/output data recorded.  PE: Gen:  Alert, NAD, pleasant Abd: Soft, ND, minimal suprapubic tenderness to palpation, improved from yesterday Psych: A&Ox3  Lab Results:  Recent Labs    05/14/23 0404 05/15/23 0409  WBC 19.7* 17.7*  HGB 13.3 13.8  HCT 44.3 45.5  PLT 453* 424*   BMET Recent Labs    05/14/23 0404 05/15/23 0409  NA 138 137  K 3.5 3.4*  CL 100 99  CO2 28 25  GLUCOSE 141* 123*  BUN 9 9  CREATININE 0.67 0.77  CALCIUM 9.0 9.1   PT/INR Recent Labs    05/16/23 0433  LABPROT 14.5  INR 1.1   CMP     Component Value Date/Time   NA 137 05/15/2023 0409   K 3.4 (L) 05/15/2023 0409   CL 99 05/15/2023 0409   CO2 25 05/15/2023 0409   GLUCOSE 123 (H) 05/15/2023 0409   BUN 9 05/15/2023 0409   CREATININE 0.77 05/15/2023 0409   CALCIUM 9.1 05/15/2023 0409   PROT 7.5 05/15/2023 0409   ALBUMIN 3.0 (L) 05/15/2023 0409   AST 21 05/15/2023 0409   ALT 37 05/15/2023 0409   ALKPHOS 89 05/15/2023 0409   BILITOT 0.6 05/15/2023 0409   GFRNONAA >60 05/15/2023 0409   Lipase  No results found for: "LIPASE"  Studies/Results: CT ABDOMEN PELVIS W CONTRAST Result Date: 05/14/2023 CLINICAL DATA:  Left lower quadrant pain EXAM: CT ABDOMEN AND PELVIS WITH CONTRAST TECHNIQUE: Multidetector CT imaging of the abdomen and pelvis was performed using the standard protocol following bolus administration of intravenous contrast. RADIATION DOSE REDUCTION: This  exam was performed according to the departmental dose-optimization program which includes automated exposure control, adjustment of the mA and/or kV according to patient size and/or use of iterative reconstruction technique. CONTRAST:  OMNIPAQUE  IOHEXOL  300 MG/ML  SOLN COMPARISON:  CT abdomen and pelvis 05/09/2023. FINDINGS: Lower chest: No acute abnormality. Hepatobiliary: No focal liver abnormality is seen. No gallstones, gallbladder wall thickening, or biliary dilatation. Pancreas: Unremarkable. No pancreatic ductal dilatation or surrounding inflammatory changes. Spleen: Normal in size without focal abnormality. Adrenals/Urinary Tract: The kidneys and adrenal glands are within normal limits. There is mild wall thickening and inflammatory stranding of the superior bladder wall. The bladder is otherwise within normal limits. Stomach/Bowel: Again seen is sigmoid colon diverticulosis with wall thickening and surrounding inflammation compatible with acute diverticulitis. Adjacent to the mid sigmoid colon in the midline pelvis there is enhance sing fluid collection which has increased in size measuring 3.9 x 2.8 by 5.7 cm. There is marked inflammatory stranding surrounding this fluid collection. There also small foci of free air in the mesentery at this level. There is no bowel obstruction. The appendix, stomach, and small bowel loops appear within normal limits. Vascular/Lymphatic: No significant vascular findings are present. No enlarged abdominal or pelvic lymph nodes. Reproductive: Prostate is unremarkable. Other: No abdominal wall hernia or abnormality. No abdominopelvic ascites. Musculoskeletal: No acute or significant osseous findings.  IMPRESSION: 1. Acute sigmoid colon diverticulitis with interval increase in size of pericolonic abscess measuring up to 5.7 cm. 2. Small foci of free air in the mesentery at the level of the abscess compatible with perforation. 3. Mild wall thickening and inflammatory  stranding of the superior bladder wall may be reactive or related to cystitis. Electronically Signed   By: Tyron Gallon M.D.   On: 05/14/2023 18:19    Anti-infectives: Anti-infectives (From admission, onward)    Start     Dose/Rate Route Frequency Ordered Stop   05/09/23 2230  metroNIDAZOLE  (FLAGYL ) IVPB 500 mg        500 mg 100 mL/hr over 60 Minutes Intravenous Every 12 hours 05/09/23 2220     05/09/23 1745  cefTAZidime (FORTAZ) 2 g in sodium chloride  0.9 % 100 mL IVPB  Status:  Discontinued        2 g 200 mL/hr over 30 Minutes Intravenous  Once 05/09/23 1735 05/09/23 1735   05/09/23 1745  cefTRIAXone  (ROCEPHIN ) 2 g in sodium chloride  0.9 % 100 mL IVPB        2 g 200 mL/hr over 30 Minutes Intravenous Every 24 hours 05/09/23 1736          Assessment/Plan Sigmoid diverticulitis w/ abscess - No current indication for emergency surgery - Cont IV abx - Repeat CT 5/4 w/ sigmoid colon diverticulitis with 5.7 pericolonic abscess and small foci of free air in the mesentery at the level of the abscess - IR drainage today - Hopefully patient will improve with conservative treatment.  If patient fails to improve he may require surgical intervention resulting in a colectomy/colostomy.  - If patient improves with conservative therapies would recommend colonoscopy in ~6-8 weeks.   - General surgery will continue to follow   FEN - NPO for procedure, can have regular diet post procedure VTE - SCDs, Lovenox  held for procedure, can restart post-procedure from surgical perspective ID - Rocephin /Flagyl   I reviewed nursing notes, hospitalist notes, last 24 h vitals and pain scores, last 48 h intake and output, last 24 h labs and trends, and last 24 h imaging results.   LOS: 7 days    Edmon Gosling, MD [facility] 05/16/2023, 9:08 AM Please see Amion for pager number during day hours 7:00am-4:30pm

## 2023-05-16 NOTE — Progress Notes (Signed)
 Patient ID: Glenn Dorsey, male   DOB: 1985-09-29, 38 y.o.   MRN: [id number] Due to emergent cases in IR today patient's abdominal abscess drain placement has been rescheduled for 5/7.  Nurse updated.

## 2023-05-16 NOTE — Progress Notes (Addendum)
 PROGRESS NOTE    Glenn Dorsey  ZOX:[id number] DOB: 07/19/1985 DOA: 05/09/2023 PCP: Bari Boos, FNP  Chief Complaint  Patient presents with   Abdominal Pain    Brief Narrative:   38 y.o. male with medical history significant for obesity who presented to the ED for evaluation of lower abdominal pain.  CT findings concerning for complicated diverticulitis.   IR planning to drain pericolonic abscess 5/6.   Assessment & Plan:   Principal Problem:   Sepsis (HCC) Active Problems:   Abscess of sigmoid colon due to diverticulitis   Hypokalemia   Diverticulitis  Drain delayed to 5/7  Sepsis  Complicated Diverticulitis with Abscess Met criteria for sepsis with fever, tachycardia, leukocytosis and diverticulitis with abscess CT with diverticulitis of the sigmoid colon with pericolonic stranding and abscess measuring 3.1 cm in diameter Appreciate surgery assistance, trial fulls - no emergent surgical needs - repeat CT per surgery (sigmoid colon diverticulitis with increase in size of pericolonic abscess 5.7 cm, mall foci of free air in mesentery at level of abscess), IR planning for drain 5/6--- needs outpatient colonoscopy in 6 weeks  Leukocytosis elevated and fluctuating   Severe Asymptomatic Hypertension Ensure cuff appropriately sized  Amlodipine  and hydrochlorothiazide  started here. Adjust prn   Hypokalemia Follow  Hyperglycemia Follow A1c  Obesity Body mass index is 50.04 kg/m.    DVT prophylaxis: SCD Code Status: full Family Communication: none Disposition:   Status is: Inpatient Remains inpatient appropriate because: need for ongoing inpt care   Consultants:  Surgery IR  Procedures:  none  Antimicrobials:  Anti-infectives (From admission, onward)    Start     Dose/Rate Route Frequency Ordered Stop   05/09/23 2230  metroNIDAZOLE  (FLAGYL ) IVPB 500 mg        500 mg 100 mL/hr over 60 Minutes Intravenous Every 12 hours 05/09/23 2220     05/09/23  1745  cefTAZidime (FORTAZ) 2 g in sodium chloride  0.9 % 100 mL IVPB  Status:  Discontinued        2 g 200 mL/hr over 30 Minutes Intravenous  Once 05/09/23 1735 05/09/23 1735   05/09/23 1745  cefTRIAXone  (ROCEPHIN ) 2 g in sodium chloride  0.9 % 100 mL IVPB        2 g 200 mL/hr over 30 Minutes Intravenous Every 24 hours 05/09/23 1736         Subjective: No complaints  Objective: Vitals:   05/15/23 1452 05/15/23 2118 05/16/23 0538 05/16/23 1208  BP: (!) 153/95 (!) 150/99 (!) 149/89 (!) 132/93  Pulse: 96 (!) 111 97 100  Resp: 16 16 16 18   Temp: 99.5 F (37.5 C) 100 F (37.8 C) 98.5 F (36.9 C) 98.9 F (37.2 C)  TempSrc:  Oral  Oral  SpO2: 97% 95% 93% 97%  Weight:      Height:        Intake/Output Summary (Last 24 hours) at 05/16/2023 1509 Last data filed at 05/15/2023 2300 Gross per 24 hour  Intake 740.06 ml  Output --  Net 740.06 ml   Filed Weights   05/09/23 1644  Weight: (!) 140.6 kg    Examination:  General: No acute distress. Cardiovascular: RRR Lungs: unlabored Abdomen: Soft, nontender, nondistended - protuberant Neurological: Alert and oriented 3. Moves all extremities 4 with equal strength. Cranial nerves II through XII grossly intact. Extremities: No clubbing or cyanosis. No edema.   Data Reviewed: I have personally reviewed following labs and imaging studies  CBC: Recent Labs  Lab 05/12/23 0636 05/13/23  1610 05/14/23 0404 05/15/23 0409 05/16/23 0835  WBC 18.2* 20.0* 19.7* 17.7* 19.1*  NEUTROABS 14.3* 14.7* 14.9* 13.0* 15.2*  HGB 13.3 13.5 13.3 13.8 13.1  HCT 43.3 45.1 44.3 45.5 42.0  MCV 86.4 87.4 87.4 86.2 84.7  PLT 389 421* 453* 424* 467*    Basic Metabolic Panel: Recent Labs  Lab 05/11/23 0401 05/12/23 0636 05/13/23 0446 05/14/23 0404 05/15/23 0409 05/16/23 0835  NA 135 136 136 138 137 137  K 3.7 3.4* 3.3* 3.5 3.4* 3.5  CL 95* 98 98 100 99 98  CO2 24 26 26 28 25 27   GLUCOSE 137* 134* 112* 141* 123* 127*  BUN 6 7 9 9 9 10    CREATININE 0.83 0.71 0.65 0.67 0.77 0.72  CALCIUM 8.7* 8.9 9.0 9.0 9.1 9.2  MG 2.4 2.3 2.5* 2.4 2.3  --   PHOS 4.8* 4.2 5.4* 4.5 4.9*  --     GFR: Estimated Creatinine Clearance: 170.6 mL/min (by C-G formula based on SCr of 0.72 mg/dL).  Liver Function Tests: Recent Labs  Lab 05/12/23 0636 05/13/23 0446 05/14/23 0404 05/15/23 0409 05/16/23 0835  AST 24 31 21 21  14*  ALT 45* 47* 40 37 29  ALKPHOS 101 104 93 89 81  BILITOT 0.8 0.5 0.7 0.6 0.8  PROT 7.7 7.6 7.5 7.5 7.8  ALBUMIN 3.0* 3.1* 3.0* 3.0* 3.2*    CBG: No results for input(s): "GLUCAP" in the last 168 hours.   Recent Results (from the past 240 hours)  Blood culture (routine x 2)     Status: None   Collection Time: 05/09/23  6:41 PM   Specimen: BLOOD  Result Value Ref Range Status   Specimen Description   Final    BLOOD RIGHT ANTECUBITAL Performed at [facility], [address] [place], [place] [address]    Special Requests   Final    BOTTLES DRAWN AEROBIC AND ANAEROBIC Blood Culture adequate volume Performed at [facility], [address] [place], [place] [address]    Culture   Final    NO GROWTH 5 DAYS Performed at [facility], [address] [place], [place] [address]    Report Status 05/14/2023 FINAL  Final  Resp panel by RT-PCR (RSV, Flu [name], Covid) Anterior Nasal Swab     Status: None   Collection Time: 05/09/23  6:48 PM   Specimen: Anterior Nasal Swab  Result Value Ref Range Status   SARS Coronavirus 2 by RT PCR NEGATIVE NEGATIVE Final    Comment: (NOTE) SARS-CoV-2 target nucleic acids are NOT DETECTED.  The SARS-CoV-2 RNA is generally detectable in upper respiratory specimens during the acute phase of infection. The lowest concentration of SARS-CoV-2 viral copies this assay can detect is 138 copies/mL. [name] negative result does not preclude SARS-Cov-2 infection and should not be used as the sole basis for treatment or other patient management  decisions. [name] negative result may occur with  improper specimen collection/handling, submission of specimen other than nasopharyngeal swab, presence of viral mutation(s) within the areas targeted by this assay, and inadequate number of viral copies(<138 copies/mL). Kolbi Altadonna negative result must be combined with clinical observations, patient history, and epidemiological information. The expected result is Negative.  Fact Sheet for Patients:  BloggerCourse.com  Fact Sheet for Healthcare Providers:  SeriousBroker.it  This test is no t yet approved or cleared by the [place]  FDA and  has been authorized for detection and/or diagnosis of SARS-CoV-2 by FDA under an Emergency Use  Authorization (EUA). This EUA will remain  in effect (meaning this test can be used) for the duration of the COVID-19 declaration under Section 564(b)(1) of the Act, 21 U.S.C.section 360bbb-3(b)(1), unless the authorization is terminated  or revoked sooner.       Influenza Jadden Yim by PCR NEGATIVE NEGATIVE Final   Influenza B by PCR NEGATIVE NEGATIVE Final    Comment: (NOTE) The Xpert Xpress SARS-CoV-2/FLU/RSV plus assay is intended as an aid in the diagnosis of influenza from Nasopharyngeal swab specimens and should not be used as Tesneem Dufrane sole basis for treatment. Nasal washings and aspirates are unacceptable for Xpert Xpress SARS-CoV-2/FLU/RSV testing.  Fact Sheet for Patients: BloggerCourse.com  Fact Sheet for Healthcare Providers: SeriousBroker.it  This test is not yet approved or cleared by the [place]  FDA and has been authorized for detection and/or diagnosis of SARS-CoV-2 by FDA under an Emergency Use Authorization (EUA). This EUA will remain in effect (meaning this test can be used) for the duration of the COVID-19 declaration under Section 564(b)(1) of the Act, 21 U.S.C. section 360bbb-3(b)(1), unless the  authorization is terminated or revoked.     Resp Syncytial Virus by PCR NEGATIVE NEGATIVE Final    Comment: (NOTE) Fact Sheet for Patients: BloggerCourse.com  Fact Sheet for Healthcare Providers: SeriousBroker.it  This test is not yet approved or cleared by the [place]  FDA and has been authorized for detection and/or diagnosis of SARS-CoV-2 by FDA under an Emergency Use Authorization (EUA). This EUA will remain in effect (meaning this test can be used) for the duration of the COVID-19 declaration under Section 564(b)(1) of the Act, 21 U.S.C. section 360bbb-3(b)(1), unless the authorization is terminated or revoked.  Performed at [facility], [address] [place], [place] [address]   Blood culture (routine x 2)     Status: None   Collection Time: 05/09/23  8:54 PM   Specimen: BLOOD LEFT ARM  Result Value Ref Range Status   Specimen Description   Final    BLOOD LEFT ARM Performed at [facility], [address] [place], [place] [address]    Special Requests   Final    BOTTLES DRAWN AEROBIC AND ANAEROBIC Blood Culture adequate volume Performed at [facility], [address], [place], [place] [address]    Culture   Final    NO GROWTH 5 DAYS Performed at [facility], [address] [place], [place] [address]    Report Status 05/14/2023 FINAL  Final         Radiology Studies: No results found.        Scheduled Meds:  amLODipine   5 mg Oral Daily   [START ON 05/17/2023] enoxaparin  (LOVENOX ) injection  40 mg Subcutaneous Daily   feeding supplement (KATE FARMS STANDARD ENT 1.4)  325 mL Oral BID BM   hydrochlorothiazide   12.5 mg Oral Daily   sodium chloride  flush  3 mL Intravenous Q12H   Continuous Infusions:  cefTRIAXone  (ROCEPHIN )  IV Stopped (05/15/23 1754)   metronidazole  500 mg (05/16/23 1043)     LOS: 7 days    Time spent: over 30  min    Donnetta Gains, MD [facility]   To contact the attending provider between 7A-7P or the covering provider during after hours 7P-7A, please log into the web site [email or web address] and access using universal [place] password for that web site. If you do not have the password, please call the hospital operator.  05/16/2023, 3:09 PM

## 2023-05-17 ENCOUNTER — Inpatient Hospital Stay (HOSPITAL_COMMUNITY)

## 2023-05-17 DIAGNOSIS — K5792 Diverticulitis of intestine, part unspecified, without perforation or abscess without bleeding: Secondary | ICD-10-CM | POA: Diagnosis not present

## 2023-05-17 LAB — CBC
HCT: 43.8 % (ref 39.0–52.0)
Hemoglobin: 13.7 g/dL (ref 13.0–17.0)
MCH: 26.5 pg (ref 26.0–34.0)
MCHC: 31.3 g/dL (ref 30.0–36.0)
MCV: 84.7 fL (ref 80.0–100.0)
Platelets: 451 10*3/uL — ABNORMAL HIGH (ref 150–400)
RBC: 5.17 MIL/uL (ref 4.22–5.81)
RDW: 12.7 % (ref 11.5–15.5)
WBC: 19.1 10*3/uL — ABNORMAL HIGH (ref 4.0–10.5)
nRBC: 0 % (ref 0.0–0.2)

## 2023-05-17 LAB — COMPREHENSIVE METABOLIC PANEL WITH GFR
ALT: 25 U/L (ref 0–44)
AST: 14 U/L — ABNORMAL LOW (ref 15–41)
Albumin: 3.3 g/dL — ABNORMAL LOW (ref 3.5–5.0)
Alkaline Phosphatase: 82 U/L (ref 38–126)
Anion gap: 14 (ref 5–15)
BUN: 9 mg/dL (ref 6–20)
CO2: 26 mmol/L (ref 22–32)
Calcium: 8.7 mg/dL — ABNORMAL LOW (ref 8.9–10.3)
Chloride: 93 mmol/L — ABNORMAL LOW (ref 98–111)
Creatinine, Ser: 0.77 mg/dL (ref 0.61–1.24)
GFR, Estimated: >60 mL/min (ref >60)
Glucose, Bld: 116 mg/dL — ABNORMAL HIGH (ref 70–99)
Potassium: 3.2 mmol/L — ABNORMAL LOW (ref 3.5–5.1)
Sodium: 133 mmol/L — ABNORMAL LOW (ref 135–145)
Total Bilirubin: 0.7 mg/dL (ref 0.0–1.2)
Total Protein: 7.9 g/dL (ref 6.5–8.1)

## 2023-05-17 LAB — GLUCOSE, CAPILLARY: Glucose-Capillary: 152 mg/dL — ABNORMAL HIGH (ref 70–99)

## 2023-05-17 LAB — MAGNESIUM: Magnesium: 2.5 mg/dL — ABNORMAL HIGH (ref 1.7–2.4)

## 2023-05-17 LAB — PHOSPHORUS: Phosphorus: 5 mg/dL — ABNORMAL HIGH (ref 2.5–4.6)

## 2023-05-17 MED ORDER — LIDOCAINE HCL 1 % IJ SOLN
INTRAMUSCULAR | Status: AC | PRN
Start: 2023-05-17 — End: 2023-05-17
  Administered 2023-05-17: 10 mL via INTRADERMAL

## 2023-05-17 MED ORDER — POTASSIUM CHLORIDE CRYS ER 20 MEQ PO TBCR
20.0000 meq | EXTENDED_RELEASE_TABLET | ORAL | Status: AC
  Administered 2023-05-17 (×2): 20 meq via ORAL
  Filled 2023-05-17 (×2): qty 1

## 2023-05-17 MED ORDER — SODIUM CHLORIDE 0.9% FLUSH
5.0000 mL | Freq: Three times a day (TID) | INTRAVENOUS | Status: DC
  Administered 2023-05-17 – 2023-05-19 (×7): 5 mL

## 2023-05-17 MED ORDER — MIDAZOLAM HCL 2 MG/2ML IJ SOLN
INTRAMUSCULAR | Status: AC | PRN
  Administered 2023-05-17: 1 mg via INTRAVENOUS

## 2023-05-17 MED ORDER — FLUMAZENIL 0.5 MG/5ML IV SOLN
INTRAVENOUS | Status: AC
  Filled 2023-05-17: qty 5

## 2023-05-17 MED ORDER — FENTANYL CITRATE (PF) 100 MCG/2ML IJ SOLN
INTRAMUSCULAR | Status: AC
  Filled 2023-05-17: qty 4

## 2023-05-17 MED ORDER — FENTANYL CITRATE (PF) 100 MCG/2ML IJ SOLN
INTRAMUSCULAR | Status: AC | PRN
  Administered 2023-05-17: 50 ug via INTRAVENOUS

## 2023-05-17 MED ORDER — DIPHENHYDRAMINE HCL 50 MG/ML IJ SOLN
INTRAMUSCULAR | Status: AC | PRN
  Administered 2023-05-17: 25 mg via INTRAVENOUS

## 2023-05-17 MED ORDER — DIPHENHYDRAMINE HCL 50 MG/ML IJ SOLN
INTRAMUSCULAR | Status: AC
  Filled 2023-05-17: qty 1

## 2023-05-17 MED ORDER — POTASSIUM CHLORIDE 10 MEQ/100ML IV SOLN: 10.0000 meq | INTRAVENOUS | Status: DC

## 2023-05-17 MED ORDER — MIDAZOLAM HCL 2 MG/2ML IJ SOLN
INTRAMUSCULAR | Status: AC
  Filled 2023-05-17: qty 4

## 2023-05-17 MED ORDER — NALOXONE HCL 0.4 MG/ML IJ SOLN
INTRAMUSCULAR | Status: AC
Start: 2023-05-17 — End: ?
  Filled 2023-05-17: qty 1

## 2023-05-17 NOTE — Progress Notes (Signed)
   05/17/23 1243  Vitals  Temp 98.3 F (36.8 C)  Temp Source Oral  BP 129/85  MAP (mmHg) 98  Pulse Rate 97  MEWS COLOR  MEWS Score Color Green  Oxygen Therapy  SpO2 93 %  O2 Device Room Air  MEWS Score  MEWS Temp 0  MEWS Systolic 0  MEWS Pulse 0  MEWS RR 0  MEWS LOC 1  MEWS Score 1   Patient returned to floor from IR w/nausea, diaphoresis, but no new complaints of abdominal pain.Vitals WNL. Lucinda Saber MD and Renie Carver MD made aware. Rizwan at bedside and gave verbal to start CLD.

## 2023-05-17 NOTE — Progress Notes (Signed)
 Subjective: No abdominal pain except lower abdominal pain right before a bm. Relieved after having bm. He did have a bm yesterday. No po intake yesterday. No n/v. About to go to IR.   Afebrile. HR 106 this am. No hypotension. WBC stable at 19.1.   Objective: Vital signs in last 24 hours: Temp:  [98.4 F (36.9 C)-99.6 F (37.6 C)] 98.4 F (36.9 C) (05/07 0556) Pulse Rate:  [100-108] 106 (05/07 0556) Resp:  [16-20] 20 (05/07 0556) BP: (126-138)/(93-95) 126/95 (05/07 0556) SpO2:  [96 %-98 %] 96 % (05/07 0556) Last BM Date : 05/16/23  Intake/Output from previous day: No intake/output data recorded. Intake/Output this shift: No intake/output data recorded.  PE: Gen:  Alert, NAD, pleasant Abd: Soft, ND, minimal suprapubic tenderness to palpation, no rigidity or guarding.  Psych: A&Ox3  Lab Results:  Recent Labs    05/16/23 0835 05/17/23 0410  WBC 19.1* 19.1*  HGB 13.1 13.7  HCT 42.0 43.8  PLT 467* 451*   BMET Recent Labs    05/16/23 0835 05/17/23 0410  NA 137 133*  K 3.5 3.2*  CL 98 93*  CO2 27 26  GLUCOSE 127* 116*  BUN 10 9  CREATININE 0.72 0.77  CALCIUM 9.2 8.7*   PT/INR Recent Labs    05/16/23 0433  LABPROT 14.5  INR 1.1   CMP     Component Value Date/Time   NA 133 (L) 05/17/2023 0410   K 3.2 (L) 05/17/2023 0410   CL 93 (L) 05/17/2023 0410   CO2 26 05/17/2023 0410   GLUCOSE 116 (H) 05/17/2023 0410   BUN 9 05/17/2023 0410   CREATININE 0.77 05/17/2023 0410   CALCIUM 8.7 (L) 05/17/2023 0410   PROT 7.9 05/17/2023 0410   ALBUMIN 3.3 (L) 05/17/2023 0410   AST 14 (L) 05/17/2023 0410   ALT 25 05/17/2023 0410   ALKPHOS 82 05/17/2023 0410   BILITOT 0.7 05/17/2023 0410   GFRNONAA >60 05/17/2023 0410   Lipase  No results found for: "LIPASE"  Studies/Results: No results found.   Anti-infectives: Anti-infectives (From admission, onward)    Start     Dose/Rate Route Frequency Ordered Stop   05/09/23 2230  metroNIDAZOLE  (FLAGYL ) IVPB 500  mg        500 mg 100 mL/hr over 60 Minutes Intravenous Every 12 hours 05/09/23 2220     05/09/23 1745  cefTAZidime (FORTAZ) 2 g in sodium chloride  0.9 % 100 mL IVPB  Status:  Discontinued        2 g 200 mL/hr over 30 Minutes Intravenous  Once 05/09/23 1735 05/09/23 1735   05/09/23 1745  cefTRIAXone  (ROCEPHIN ) 2 g in sodium chloride  0.9 % 100 mL IVPB        2 g 200 mL/hr over 30 Minutes Intravenous Every 24 hours 05/09/23 1736          Assessment/Plan Sigmoid diverticulitis w/ abscess - No current indication for emergency surgery - Cont IV abx - Repeat CT 5/4 w/ sigmoid colon diverticulitis with 5.7 pericolonic abscess and small foci of free air in the mesentery at the level of the abscess - IR drainage today - Hopefully patient will improve with conservative treatment.  If patient fails to improve he may require surgical intervention resulting in a colectomy/colostomy.  - If patient improves with conservative therapies would recommend colonoscopy in ~6-8 weeks.   - General surgery will continue to follow  FEN - NPO for procedure, can have regular diet post  procedure VTE - SCDs, Lovenox  held for procedure, can restart post-procedure from surgical perspective ID - Rocephin /Flagyl   I reviewed nursing notes, hospitalist notes, last 24 h vitals and pain scores, last 48 h intake and output, last 24 h labs and trends, and last 24 h imaging results.   LOS: 8 days    Delton Filbert, [facility] 05/17/2023, 11:07 AM Please see Amion for pager number during day hours 7:00am-4:30pm

## 2023-05-17 NOTE — Procedures (Signed)
 Interventional Radiology Procedure Note  Procedure: CT DRAIN LOWER ABD ABSCESS    Complications: None  Estimated Blood Loss:  MIN  Findings: 35CC PUS CX SENT 10 FR DRAIN     Glenn Even, MD

## 2023-05-17 NOTE — Progress Notes (Signed)
 [facility] Progress Note  Patient: Glenn Dorsey     WUJ:[id number]  DOA: 05/09/2023   PCP: Bari Boos, FNP       Brief hospital course: 38 year old male presented for lower abdominal pain, nausea and fever and found to have acute diverticulitis of the sigmoid colon with pericolonic stranding and abscess measuring 3.1 cm on the CT with contrast.   Subjective:  Was evaluated after drain was placed.  States that he was very sweaty after this procedure was done but did not have any other symptoms.  He is still having some lower abdominal pain but is open to trying clear liquids.  Currently not nauseated, no diarrhea and no bloody bowel movements.  Urinating well.  Assessment and Plan: Principal Problem:   Sepsis related to diverticular abscess - Continue ceftriaxone , Flagyl  and IV fluids - Follow drain output - Start clear liquids - Follow-up WBC count - Appreciate general surgery evaluation  Active Problems: Hypokalemia - Replace and follow - Magnesium is normal  Obesity class III Estimated body mass index is 50.04 kg/m as calculated from the following:   Height as of this encounter: 5\' 6"  (1.676 m).   Weight as of this encounter: 140.6 kg.       Code Status: Full Code Total time on patient care: 35 minutes DVT prophylaxis:  enoxaparin  (LOVENOX ) injection 40 mg Start: 05/18/23 1000 SCDs Start: 05/09/23 2316     Objective:   Vitals:   05/17/23 1140 05/17/23 1145 05/17/23 1150 05/17/23 1243  BP: (!) 148/90 (!) 156/106 (!) 153/98 129/85  Pulse: (!) 101 (!) 101 100 97  Resp: (!) 21 19 20    Temp:    98.3 F (36.8 C)  TempSrc:    Oral  SpO2: 97% 98% 98% 93%  Weight:      Height:       Filed Weights   05/09/23 1644  Weight: (!) 140.6 kg   Exam: General exam: Appears comfortable  HEENT: oral mucosa moist Respiratory system: Clear to auscultation.  Cardiovascular system: S1 & S2 heard  Gastrointestinal system: Abdomen soft, tender across lower  abdomen, nondistended. Normal bowel sounds-drain contains serosanguineous fluid-about 10 cc Extremities: No cyanosis, clubbing or edema Psychiatry:  Mood & affect appropriate.      CBC: Recent Labs  Lab 05/12/23 0636 05/13/23 0446 05/14/23 0404 05/15/23 0409 05/16/23 0835 05/17/23 0410  WBC 18.2* 20.0* 19.7* 17.7* 19.1* 19.1*  NEUTROABS 14.3* 14.7* 14.9* 13.0* 15.2*  --   HGB 13.3 13.5 13.3 13.8 13.1 13.7  HCT 43.3 45.1 44.3 45.5 42.0 43.8  MCV 86.4 87.4 87.4 86.2 84.7 84.7  PLT 389 421* 453* 424* 467* 451*   Basic Metabolic Panel: Recent Labs  Lab 05/12/23 0636 05/13/23 0446 05/14/23 0404 05/15/23 0409 05/16/23 0835 05/17/23 0410  NA 136 136 138 137 137 133*  K 3.4* 3.3* 3.5 3.4* 3.5 3.2*  CL 98 98 100 99 98 93*  CO2 26 26 28 25 27 26   GLUCOSE 134* 112* 141* 123* 127* 116*  BUN 7 9 9 9 10 9   CREATININE 0.71 0.65 0.67 0.77 0.72 0.77  CALCIUM 8.9 9.0 9.0 9.1 9.2 8.7*  MG 2.3 2.5* 2.4 2.3  --  2.5*  PHOS 4.2 5.4* 4.5 4.9*  --  5.0*     Scheduled Meds:  amLODipine   5 mg Oral Daily   [START ON 05/18/2023] enoxaparin  (LOVENOX ) injection  40 mg Subcutaneous Daily   feeding supplement (KATE FARMS STANDARD ENT 1.4)  325 mL Oral  BID BM   potassium chloride   20 mEq Oral Q4H   sodium chloride  flush  3 mL Intravenous Q12H   sodium chloride  flush  5 mL Intracatheter Q8H    Imaging and lab data personally reviewed   Author: Sedalia Dacosta  05/17/2023 2:21 PM  To contact [facility]>   Check the care team in [facility] and look for the attending/consulting [facility] provider listed  Log into [email or web address] and use [place] universal password   Go to> "[facility]"  and find provider  If you still have difficulty reaching the provider, please page the [facility] (Director on Call) for the Hospitalists listed on amion

## 2023-05-18 DIAGNOSIS — K5792 Diverticulitis of intestine, part unspecified, without perforation or abscess without bleeding: Secondary | ICD-10-CM | POA: Diagnosis not present

## 2023-05-18 LAB — CBC
HCT: 42.9 % (ref 39.0–52.0)
Hemoglobin: 13.2 g/dL (ref 13.0–17.0)
MCH: 26.2 pg (ref 26.0–34.0)
MCHC: 30.8 g/dL (ref 30.0–36.0)
MCV: 85.1 fL (ref 80.0–100.0)
Platelets: 477 10*3/uL — ABNORMAL HIGH (ref 150–400)
RBC: 5.04 MIL/uL (ref 4.22–5.81)
RDW: 12.8 % (ref 11.5–15.5)
WBC: 19.1 10*3/uL — ABNORMAL HIGH (ref 4.0–10.5)
nRBC: 0 % (ref 0.0–0.2)

## 2023-05-18 LAB — BASIC METABOLIC PANEL WITH GFR
Anion gap: 10 (ref 5–15)
BUN: 11 mg/dL (ref 6–20)
CO2: 27 mmol/L (ref 22–32)
Calcium: 9.1 mg/dL (ref 8.9–10.3)
Chloride: 98 mmol/L (ref 98–111)
Creatinine, Ser: 0.74 mg/dL (ref 0.61–1.24)
GFR, Estimated: >60 mL/min (ref >60)
Glucose, Bld: 128 mg/dL — ABNORMAL HIGH (ref 70–99)
Potassium: 3.6 mmol/L (ref 3.5–5.1)
Sodium: 135 mmol/L (ref 135–145)

## 2023-05-18 NOTE — Progress Notes (Signed)
 Referring Physician(s): Burton,V  Supervising Physician: Myrlene Asper  Patient Status:  [facility] - In-pt  Chief Complaint: Abdominal pain, diverticular abscess; status post abdominal abscess drain placement on 5/7   Subjective: Patient doing fairly well today.  Denies worsening abdominal pain, nausea, vomiting.  Tolerating diet, ambulating; some soreness at drain insertion site.   Allergies: Peanut butter flavoring agent (non-screening)  Medications: Prior to Admission medications   Medication Sig Start Date End Date Taking? Authorizing Provider  albuterol (VENTOLIN HFA) 108 (90 Base) MCG/ACT inhaler Inhale 1-2 puffs into the lungs every 4 (four) hours as needed for wheezing or shortness of breath. 06/28/22  Yes [provider]  EPINEPHrine 0.3 mg/0.3 mL IJ SOAJ injection Inject 0.3 mg into the muscle as needed for anaphylaxis. 11/17/21  Yes [provider]  ibuprofen (ADVIL) 200 MG tablet Take 200-800 mg by mouth every 6 (six) hours as needed for moderate pain (pain score 4-6).   Yes [provider]  OVER THE COUNTER MEDICATION Take 1 tablet by mouth daily. Allergen Tablet   Yes [provider]  Pseudoephedrine-APAP-DM (DAYQUIL MULTI-SYMPTOM PO) Take 1 Dose by mouth every 4 (four) hours as needed (Cold/cough).   Yes [provider]     Vital Signs: BP (!) 144/86 (BP Location: Left Arm)   Pulse 98   Temp 98.3 F (36.8 C) (Oral)   Resp 18   Ht 5\' 6"  (1.676 m)   Wt (!) 310 lb (140.6 kg)   SpO2 97%   BMI 50.04 kg/m   Physical Exam awake, alert.  Mid lower abdominal drain intact, insertion site okay, mildly tender, output 65 cc hazy blood-tinged fluid; drain irrigated without difficulty  Imaging: CT GUIDED PERITONEAL/RETROPERITONEAL FLUID DRAIN BY PERC CATH Result Date: 05/17/2023 INDICATION: MIDLINE LOWER ABDOMINAL DIVERTICULAR ABSCESS EXAM: CT DRAINAGE LOWER ABDOMINAL DIVERTICULAR ABSCESS MEDICATIONS: The patient is currently  admitted to the hospital and receiving intravenous antibiotics. The antibiotics were administered within an appropriate time frame prior to the initiation of the procedure. ANESTHESIA/SEDATION: Moderate (conscious) sedation was employed during this procedure. A total of Versed  2.0 mg and Fentanyl  100 mcg was administered intravenously by the radiology nurse. Total intra-service moderate Sedation Time: 20 minutes. The patient's level of consciousness and vital signs were monitored continuously by radiology nursing throughout the procedure under my direct supervision. COMPLICATIONS: None immediate PROCEDURE: Informed written consent was obtained from the patient after a thorough discussion of the procedural risks, benefits and alternatives. All questions were addressed. Maximal Sterile Barrier Technique was utilized including caps, mask, sterile gowns, sterile gloves, sterile drape, hand hygiene and skin antiseptic. A timeout was performed prior to the initiation of the procedure. Previous imaging reviewed. Patient positioned supine. Noncontrast localization CT performed through the lower abdomen. The midline diverticular abscess was localized and marked for a left anterior oblique approach. Under sterile conditions and local anesthesia, the 18 gauge 15 cm access needle was advanced from an anterior oblique approach through the rectus musculature into the abscess. Needle position confirmed with CT. Syringe aspiration yielded purulent fluid. Sample sent culture. Guidewire inserted. Guidewire position confirmed with CT. Tract dilatation performed to insert a 10 [place] drain. Drain catheter position confirmed with CT. Syringe aspiration yielded 35 cc purulent fluid. This collapsed the abscess cavity. No immediate complication. Patient tolerated the procedure well. Catheter secured with a silk suture and a external suction bulb. Sterile dressing applied. Patient tolerated the procedure well. IMPRESSION: Successful  CT-guided midline lower abdominal diverticular abscess drain placement as above.  Electronically Signed   By: Melven Stable.  Shick M.D.   On: 05/17/2023 13:30    Labs:  CBC: Recent Labs    05/15/23 0409 05/16/23 0835 05/17/23 0410 05/18/23 0402  WBC 17.7* 19.1* 19.1* 19.1*  HGB 13.8 13.1 13.7 13.2  HCT 45.5 42.0 43.8 42.9  PLT 424* 467* 451* 477*    COAGS: Recent Labs    05/16/23 0433  INR 1.1    BMP: Recent Labs    05/15/23 0409 05/16/23 0835 05/17/23 0410 05/18/23 0402  NA 137 137 133* 135  K 3.4* 3.5 3.2* 3.6  CL 99 98 93* 98  CO2 25 27 26 27   GLUCOSE 123* 127* 116* 128*  BUN 9 10 9 11   CALCIUM 9.1 9.2 8.7* 9.1  CREATININE 0.77 0.72 0.77 0.74  GFRNONAA >60 >60 >60 >60    LIVER FUNCTION TESTS: Recent Labs    05/14/23 0404 05/15/23 0409 05/16/23 0835 05/17/23 0410  BILITOT 0.7 0.6 0.8 0.7  AST 21 21 14* 14*  ALT 40 37 29 25  ALKPHOS 93 89 81 82  PROT 7.5 7.5 7.8 7.9  ALBUMIN 3.0* 3.0* 3.2* 3.3*    Assessment and Plan: Patient with history of diverticulitis with associated abscess; status post drain placement on 5/7; afebrile, WBC 19.1 (19.1), hemoglobin stable, creatinine normal, drain fluid cultures negative to date   Drain Location: ant lower abd Size: Fr size: 10 Fr Date of placement: 05/17/23  Currently to: Drain collection device: suction bulb 24 hour output:  Output by Drain (mL) 05/16/23 0701 - 05/16/23 1900 05/16/23 1901 - 05/17/23 0700 05/17/23 0701 - 05/17/23 1900 05/17/23 1901 - 05/18/23 0700 05/18/23 0701 - 05/18/23 1533  Closed System Drain 1 LLQ Bulb (JP) 10 Fr.   65          Current examination: Flushes/aspirates easily.  Insertion site unremarkable. Suture and stat lock in place. Dressed appropriately.   Plan: Continue TID flushes with 5 cc NS. Record output Q shift. Dressing changes QD or PRN if soiled.  Call IR APP or on call IR MD if difficulty flushing or sudden change in drain output.  Repeat imaging/possible drain injection  once output < 10 mL/QD (excluding flush material). Consideration for drain removal if output is < 10 mL/QD (excluding flush material), pending discussion with the providing surgical service.  Discharge planning: Please contact IR APP or on call IR MD prior to patient d/c to ensure appropriate follow up plans are in place. Typically patient will follow up with IR clinic 10-14 days post d/c for repeat imaging/possible drain injection. IR scheduler will contact patient with date/time of appointment. Patient will need to flush drain QD with 5 cc NS, record output QD, dressing changes every 2-3 days or earlier if soiled.   IR will continue to follow - please call with questions or concerns.      Electronically Signed: D. Honore Lux, PA-C 05/18/2023, 3:22 PM   I spent a total of 15 Minutes at the the patient's bedside AND on the patient's hospital floor or unit, greater than 50% of which was counseling/coordinating care for abdominal abscess drain    Patient ID: Glenn Dorsey, male   DOB: [birth year], 38 y.o.   MRN: [id number]

## 2023-05-18 NOTE — Progress Notes (Signed)
 Subjective: No acute changes - denies abdominal pain, except when he is repositioning he has some soreness that improves once he is done moving. +flatus. Reports non-bloody stools. Ate >75% of breakfast.   Afebrile. HR 100 this am. No hypotension. WBC stable at 19  Objective: Vital signs in last 24 hours: Temp:  [97.4 F (36.3 C)-98.6 F (37 C)] 98 F (36.7 C) (05/08 0541) Pulse Rate:  [97-112] 100 (05/08 0541) Resp:  [15-25] 20 (05/08 0541) BP: (129-159)/(85-106) 137/97 (05/08 0915) SpO2:  [93 %-100 %] 97 % (05/08 0541) Last BM Date : 05/16/23  Intake/Output from previous day: 05/07 0701 - 05/08 0700 In: 245 [P.O.:240] Out: 65 [Drains:65] Intake/Output this shift: No intake/output data recorded.  PE: Gen:  Alert, NAD, pleasant Abd: Soft, ND,minimally tender around JP drain which is draining bloody, purulent fluid (65 mL documented since placement yesterday) Psych: A&Ox3  Lab Results:  Recent Labs    05/17/23 0410 05/18/23 0402  WBC 19.1* 19.1*  HGB 13.7 13.2  HCT 43.8 42.9  PLT 451* 477*   BMET Recent Labs    05/17/23 0410 05/18/23 0402  NA 133* 135  K 3.2* 3.6  CL 93* 98  CO2 26 27  GLUCOSE 116* 128*  BUN 9 11  CREATININE 0.77 0.74  CALCIUM 8.7* 9.1   PT/INR Recent Labs    05/16/23 0433  LABPROT 14.5  INR 1.1   CMP     Component Value Date/Time   NA 135 05/18/2023 0402   K 3.6 05/18/2023 0402   CL 98 05/18/2023 0402   CO2 27 05/18/2023 0402   GLUCOSE 128 (H) 05/18/2023 0402   BUN 11 05/18/2023 0402   CREATININE 0.74 05/18/2023 0402   CALCIUM 9.1 05/18/2023 0402   PROT 7.9 05/17/2023 0410   ALBUMIN 3.3 (L) 05/17/2023 0410   AST 14 (L) 05/17/2023 0410   ALT 25 05/17/2023 0410   ALKPHOS 82 05/17/2023 0410   BILITOT 0.7 05/17/2023 0410   GFRNONAA >60 05/18/2023 0402   Lipase  No results found for: "LIPASE"  Studies/Results: CT GUIDED PERITONEAL/RETROPERITONEAL FLUID DRAIN BY PERC CATH Result Date: 05/17/2023 INDICATION:  MIDLINE LOWER ABDOMINAL DIVERTICULAR ABSCESS EXAM: CT DRAINAGE LOWER ABDOMINAL DIVERTICULAR ABSCESS MEDICATIONS: The patient is currently admitted to the hospital and receiving intravenous antibiotics. The antibiotics were administered within an appropriate time frame prior to the initiation of the procedure. ANESTHESIA/SEDATION: Moderate (conscious) sedation was employed during this procedure. A total of Versed  2.0 mg and Fentanyl  100 mcg was administered intravenously by the radiology nurse. Total intra-service moderate Sedation Time: 20 minutes. The patient's level of consciousness and vital signs were monitored continuously by radiology nursing throughout the procedure under my direct supervision. COMPLICATIONS: None immediate PROCEDURE: Informed written consent was obtained from the patient after a thorough discussion of the procedural risks, benefits and alternatives. All questions were addressed. Maximal Sterile Barrier Technique was utilized including caps, mask, sterile gowns, sterile gloves, sterile drape, hand hygiene and skin antiseptic. A timeout was performed prior to the initiation of the procedure. Previous imaging reviewed. Patient positioned supine. Noncontrast localization CT performed through the lower abdomen. The midline diverticular abscess was localized and marked for a left anterior oblique approach. Under sterile conditions and local anesthesia, the 18 gauge 15 cm access needle was advanced from an anterior oblique approach through the rectus musculature into the abscess. Needle position confirmed with CT. Syringe aspiration yielded purulent fluid. Sample sent culture. Guidewire inserted. Guidewire position confirmed with CT.  Tract dilatation performed to insert a 10 [place] drain. Drain catheter position confirmed with CT. Syringe aspiration yielded 35 cc purulent fluid. This collapsed the abscess cavity. No immediate complication. Patient tolerated the procedure well. Catheter secured with  a silk suture and a external suction bulb. Sterile dressing applied. Patient tolerated the procedure well. IMPRESSION: Successful CT-guided midline lower abdominal diverticular abscess drain placement as above. Electronically Signed   By: Melven Stable.  Shick M.D.   On: 05/17/2023 13:30     Anti-infectives: Anti-infectives (From admission, onward)    Start     Dose/Rate Route Frequency Ordered Stop   05/09/23 2230  metroNIDAZOLE  (FLAGYL ) IVPB 500 mg        500 mg 100 mL/hr over 60 Minutes Intravenous Every 12 hours 05/09/23 2220     05/09/23 1745  cefTAZidime (FORTAZ) 2 g in sodium chloride  0.9 % 100 mL IVPB  Status:  Discontinued        2 g 200 mL/hr over 30 Minutes Intravenous  Once 05/09/23 1735 05/09/23 1735   05/09/23 1745  cefTRIAXone  (ROCEPHIN ) 2 g in sodium chloride  0.9 % 100 mL IVPB        2 g 200 mL/hr over 30 Minutes Intravenous Every 24 hours 05/09/23 1736          Assessment/Plan Sigmoid diverticulitis w/ abscess - No current indication for emergency surgery - Repeat CT 5/4 w/ sigmoid colon diverticulitis with 5.7 pericolonic abscess and small foci of free air in the mesentery at the level of the abscess, s/p IR drainage 5/7 Dr. Lovell Rubenstein - Cont IV abx - await cultures (GPCs) - he is currently clinically improving with non-operative measures. If his cultures finalize to help direct PO abx, and if his HR and WBC are stable/improved, he could potentially be discharged home as early as tomorrow 5/9 on PO abx. CCS will follow. - If patient improves with conservative therapies would recommend colonoscopy in ~6-8 weeks.     FEN - Reg VTE - SCDs, Lovenox   ID - Rocephin /Flagyl   I reviewed nursing notes, hospitalist notes, last 24 h vitals and pain scores, last 48 h intake and output, last 24 h labs and trends, and last 24 h imaging results.   LOS: 9 days    Charlott Converse, [facility] Surgery 05/18/2023, 9:21 AM Please see Amion for pager number during day hours  7:00am-4:30pm

## 2023-05-18 NOTE — Progress Notes (Signed)
   05/17/23 1950  Assess: MEWS Score  Temp 98.6 F (37 C)  BP (!) 148/102  MAP (mmHg) 116  Pulse Rate (!) 112  Resp 17  Level of Consciousness Alert  SpO2 96 %  O2 Device Room Air  Assess: MEWS Score  MEWS Temp 0  MEWS Systolic 0  MEWS Pulse 2  MEWS RR 0  MEWS LOC 0  MEWS Score 2  MEWS Score Color Yellow  Assess: if the MEWS score is Yellow or Red  Were vital signs accurate and taken at a resting state? Yes  Does the patient meet 2 or more of the SIRS criteria? Yes  Does the patient have a confirmed or suspected source of infection? Yes  MEWS guidelines implemented  Yes, yellow  Treat  MEWS Interventions Considered administering scheduled or prn medications/treatments as ordered  Take Vital Signs  Increase Vital Sign Frequency  Yellow: Q2hr x1, continue Q4hrs until patient remains green for 12hrs  Escalate  MEWS: Escalate Yellow: Discuss with charge nurse and consider notifying provider and/or RRT  Notify: Charge Nurse/RN  Name of Charge Nurse/RN Notified Wilber Han, RN  Provider Notification  Provider Name/Title E. Guillermo Lees, NP  Date Provider Notified 05/17/23  Time Provider Notified 2015  Method of Notification Page  Notification Reason Other (Comment) (yellow MEWS)  Provider response No new orders  Notify: Rapid Response  Name of Rapid Response RN Notified Erin Havers, RN  Date Rapid Response Notified 05/17/23  Time Rapid Response Notified 2030  Assess: SIRS CRITERIA  SIRS Temperature  0  SIRS Respirations  0  SIRS Pulse 1  SIRS WBC 0  SIRS Score Sum  1

## 2023-05-18 NOTE — Progress Notes (Signed)
 Nutrition Follow-up  DOCUMENTATION CODES:   Morbid obesity  INTERVENTION:  - Regular diet per CCS.  [employer] 1.4 PO BID, each supplement provides 455 kcal and 20 grams protein. - Encourage intake as tolerated.  - Monitor weight trends.   NUTRITION DIAGNOSIS:   Increased nutrient needs related to acute illness as evidenced by estimated needs. *ongoing  GOAL:   Patient will meet greater than or equal to 90% of their needs *progressing  MONITOR:   PO intake, Supplement acceptance, Diet advancement, Weight trends  REASON FOR ASSESSMENT:   Consult Assessment of nutrition requirement/status, Other (Comment) (supplement options)  ASSESSMENT:   38 y.o. male with PMH significant for obesity who presented with lower abdominal pain.  Admitted for sepsis with complicated diverticulitis with abscess.  4/29 Admit; NPO 4/30 CLD 5/2 FLD 5/6 NPO for procedure 5/7 NPO for procedure 5/7 CLD -> Regular diet  Patient documented to have been consuming 50-100% of meals from 4/30-5/5. Minimal intake the past 2 days since being NPO for procedures. Patient reports tolerating a regular diet this morning for breakfast. Per CCS note this AM, patient consumed >75% of breakfast today. He endorses drinking [employer] BID and tolerating.   No weight since initial admission weight to assess recent changes.    Medications reviewed and include: -  Labs reviewed:  -   Diet Order:   Diet Order             Diet regular Room service appropriate? Yes; Fluid consistency: Thin  Diet effective now                   EDUCATION NEEDS:  Education needs have been addressed  Skin:  Skin Assessment: Reviewed RN Assessment  Last BM:  5/8 - type 6  Height:  Ht Readings from Last 1 Encounters:  05/09/23 5\' 6"  (1.676 m)   Weight:  Wt Readings from Last 1 Encounters:  05/09/23 (!) 140.6 kg   Ideal Body Weight:  64.55 kg  BMI:  Body mass index is 50.04 kg/m.  Estimated Nutritional  Needs:  Kcal:  2300-2500 kcals Protein:  100-130 grams Fluid:  >/= 2.3L    Scheryl Cushing RD, LDN Contact via Secure Chat.

## 2023-05-18 NOTE — Progress Notes (Signed)
 [facility] Progress Note  Patient: Glenn Dorsey     BJY:[id number]  DOA: 05/09/2023   PCP: Bari Boos, FNP       Brief hospital course: 38 year old male presented for lower abdominal pain, nausea and fever and found to have acute diverticulitis of the sigmoid colon with pericolonic stranding and abscess measuring 3.1 cm on the CT with contrast.   Subjective:  Tolerating liquid diet.  Abdominal pain improving and now mainly present near the drain in the left lower quadrant.  Assessment and Plan: Principal Problem:   Sepsis related to diverticular abscess - Continue ceftriaxone , Flagyl  and IV fluids - 65 cc drain output yesterday - Gram-positive cocci in chains and WBC noted on Gram stain -Unfortunately WBC count has not improved although his symptoms are improving - Appreciate general surgery and IR  Active Problems: Hypokalemia - 3.2-has improved to 3.6 - Magnesium is normal  Thrombocytosis - May be secondary to acute infection-follow-up  Obesity class III Estimated body mass index is 50.04 kg/m as calculated from the following:   Height as of this encounter: 5\' 6"  (1.676 m).   Weight as of this encounter: 140.6 kg.       Code Status: Full Code Total time on patient care: 35 minutes DVT prophylaxis:  enoxaparin  (LOVENOX ) injection 40 mg Start: 05/18/23 1000 SCDs Start: 05/09/23 2316     Objective:   Vitals:   05/18/23 0159 05/18/23 0541 05/18/23 0915 05/18/23 1424  BP: (!) 159/103 (!) 139/94 (!) 137/97 (!) 144/86  Pulse: 98 100 100 98  Resp: 15 20 19 18   Temp: 98.4 F (36.9 C) 98 F (36.7 C) 98.7 F (37.1 C) 98.3 F (36.8 C)  TempSrc: Oral Oral  Oral  SpO2: 100% 97% 98% 97%  Weight:      Height:       Filed Weights   05/09/23 1644  Weight: (!) 140.6 kg   Exam: General exam: Appears comfortable  HEENT: oral mucosa moist Respiratory system: Clear to auscultation.  Cardiovascular system: S1 & S2 heard  Gastrointestinal system:  Abdomen soft, tender in left lower quadrant Extremities: No cyanosis, clubbing or edema Psychiatry:  Mood & affect appropriate.      CBC: Recent Labs  Lab 05/12/23 0636 05/13/23 0446 05/14/23 0404 05/15/23 0409 05/16/23 0835 05/17/23 0410 05/18/23 0402  WBC 18.2* 20.0* 19.7* 17.7* 19.1* 19.1* 19.1*  NEUTROABS 14.3* 14.7* 14.9* 13.0* 15.2*  --   --   HGB 13.3 13.5 13.3 13.8 13.1 13.7 13.2  HCT 43.3 45.1 44.3 45.5 42.0 43.8 42.9  MCV 86.4 87.4 87.4 86.2 84.7 84.7 85.1  PLT 389 421* 453* 424* 467* 451* 477*   Basic Metabolic Panel: Recent Labs  Lab 05/12/23 0636 05/13/23 0446 05/14/23 0404 05/15/23 0409 05/16/23 0835 05/17/23 0410 05/18/23 0402  NA 136 136 138 137 137 133* 135  K 3.4* 3.3* 3.5 3.4* 3.5 3.2* 3.6  CL 98 98 100 99 98 93* 98  CO2 26 26 28 25 27 26 27   GLUCOSE 134* 112* 141* 123* 127* 116* 128*  BUN 7 9 9 9 10 9 11   CREATININE 0.71 0.65 0.67 0.77 0.72 0.77 0.74  CALCIUM 8.9 9.0 9.0 9.1 9.2 8.7* 9.1  MG 2.3 2.5* 2.4 2.3  --  2.5*  --   PHOS 4.2 5.4* 4.5 4.9*  --  5.0*  --      Scheduled Meds:  amLODipine   5 mg Oral Daily   enoxaparin  (LOVENOX ) injection  40 mg Subcutaneous Daily  feeding supplement ([employer] STANDARD ENT 1.4)  325 mL Oral BID BM   sodium chloride  flush  3 mL Intravenous Q12H   sodium chloride  flush  5 mL Intracatheter Q8H    Imaging and lab data personally reviewed   Author: Cleva Camero  05/18/2023 4:43 PM  To contact [facility]>   Check the care team in [facility] and look for the attending/consulting [facility] provider listed  Log into [email or web address] and use [place]'s universal password   Go to> "[facility]"  and find provider  If you still have difficulty reaching the provider, please page the [facility] (Director on Call) for the Hospitalists listed on amion

## 2023-05-19 DIAGNOSIS — E66813 Obesity, class 3: Secondary | ICD-10-CM | POA: Insufficient documentation

## 2023-05-19 DIAGNOSIS — I10 Essential (primary) hypertension: Secondary | ICD-10-CM | POA: Insufficient documentation

## 2023-05-19 DIAGNOSIS — K5792 Diverticulitis of intestine, part unspecified, without perforation or abscess without bleeding: Secondary | ICD-10-CM | POA: Diagnosis not present

## 2023-05-19 LAB — CBC
HCT: 40.6 % (ref 39.0–52.0)
Hemoglobin: 12.6 g/dL — ABNORMAL LOW (ref 13.0–17.0)
MCH: 26.6 pg (ref 26.0–34.0)
MCHC: 31 g/dL (ref 30.0–36.0)
MCV: 85.7 fL (ref 80.0–100.0)
Platelets: 428 10*3/uL — ABNORMAL HIGH (ref 150–400)
RBC: 4.74 MIL/uL (ref 4.22–5.81)
RDW: 12.7 % (ref 11.5–15.5)
WBC: 14.8 10*3/uL — ABNORMAL HIGH (ref 4.0–10.5)
nRBC: 0 % (ref 0.0–0.2)

## 2023-05-19 MED ORDER — METRONIDAZOLE 500 MG PO TABS
500.0000 mg | ORAL_TABLET | Freq: Two times a day (BID) | ORAL | Status: DC
  Administered 2023-05-19: 500 mg via ORAL
  Filled 2023-05-19: qty 1

## 2023-05-19 MED ORDER — METRONIDAZOLE 500 MG PO TABS: 500.0000 mg | ORAL_TABLET | Freq: Two times a day (BID) | ORAL | 0 refills | Status: AC

## 2023-05-19 MED ORDER — CIPROFLOXACIN HCL 750 MG PO TABS: 750.0000 mg | ORAL_TABLET | Freq: Two times a day (BID) | ORAL | 0 refills | Status: DC

## 2023-05-19 MED ORDER — AMLODIPINE BESYLATE 5 MG PO TABS: 5.0000 mg | ORAL_TABLET | Freq: Every day | ORAL | 0 refills | Status: AC

## 2023-05-19 MED ORDER — LEVOFLOXACIN 750 MG PO TABS
750.0000 mg | ORAL_TABLET | Freq: Every day | ORAL | 0 refills | Status: AC
Start: 2023-05-19 — End: 2023-05-22

## 2023-05-19 MED ORDER — CIPROFLOXACIN HCL 500 MG PO TABS
750.0000 mg | ORAL_TABLET | Freq: Two times a day (BID) | ORAL | Status: DC
  Administered 2023-05-19: 750 mg via ORAL
  Filled 2023-05-19: qty 1

## 2023-05-19 NOTE — Discharge Instructions (Signed)
 Interventional Radiology Percutaneous Abscess Drain Placement After Care   This sheet gives you information about how to care for yourself after your procedure. Your health care provider may also give you more specific instructions. Your drain was placed by an interventional radiologist with [facility]. If you have questions or concerns, contact [facility] at [phone].   What is a percutaneous drain?   A drain is a small plastic tube (catheter) that goes into the fluid collection in your body through your skin.   How long will I need the drain?   How long the drain needs to stay in is determined by where the drain is, how much comes out of the drain each day and if you are having any other surgical procedures.   Interventional radiology will determine when it is time to remove the drain. It is important to follow up as directed so that the drain can be removed as soon as it is safe to do so.   What can I expect after the procedure?   After the procedure, it is common to have:   A small amount of bruising and discomfort in the area where the drainage tube (catheter) was placed.   Sleepiness and fatigue. This should go away after the medicines you were given have worn off.   Follow these instructions at home:   Insertion site care   Check your insertion site when you change the bandage. Check for:   More redness, swelling, or pain.   More fluid or blood.   Warmth.   Pus or a bad smell.   When caring for your insertion site:   Wash your hands with soap and water for at least 20 seconds before and after you change your bandage (dressing). If soap and water are not available, use hand sanitizer.   You do not need to change your dressing everyday if it is clean and dry. Change your dressing every 3 days or as needed when it is soiled, wet or becoming dislodged. You will need to change your dressing each time you shower.   Leave stitches (sutures), skin  glue, or adhesive strips in place. These skin closures may need to stay in place for 2 weeks or longer. If adhesive strip edges start to loosen and curl up, you may trim the loose edges. Do not remove adhesive strips completely unless your health care provider tells you to do so.   Catheter care   Flush the catheter once per day with 5 mL of 0.9% normal saline unless you are told otherwise by your healthcare provider. This helps to prevent clogs in the catheter.   To disconnect the drain, turn the clear plastic tube to the left. Attach the saline syringe by placing it on the white end of the drain and turning gently to the right. Once attached gently push the plunger to the 5 mL mark. After you are done flushing, disconnect the syringe by turning to the left and reattach your drainage container   If you have a bulb please be sure the bulb is charged after reconnecting it - to do this pinch the bulb between your thumb and first finger and close the stopper located on the top of the bulb.    Check for fluid leaking from around your catheter (instead of fluid draining through your catheter). This may be a sign that the drain is no longer working correctly.   Write down the following information every time you empty your  bag:   The date and time.   The amount of drainage.   Activity   Rest at home for 1-2 days after your procedure.   For the first 48 hours do not lift anything more than 10 lbs (about a gallon of milk). You may perform moderate activities/exercise. Please avoid strenuous activities during this time.   Avoid any activities which may pull on your drain as this can cause your drain to become dislodged.   If you were given a sedative during the procedure, it can affect you for several hours. Do not drive or operate machinery until your health care provider says that it is safe.   General instructions   For mild pain take over-the-counter medications as needed for pain such as  Tylenol or Advil. If you are experiencing severe pain please call our office as this may indicate an issue with your drain.    If you were prescribed an antibiotic medicine, take it as told by your health care provider. Do not stop using the antibiotic even if you start to feel better.   You may shower 24 hours after the drain is placed. To do this cover the insertion site with a water tight material such as saran wrap and seal the edges with tape, you may also purchase waterproof dressings at your local drug store. Shower as usual and then remove the water tight dressing and any gauze/tape underneath it once you have exited the shower and dried off. Allow the area to air dry or pat dry with a clean towel. Once the skin is completely dry place a new gauze dressing. It is important to keep the site dry at all times to prevent infection.   Do not submerge the drain - this means you cannot take baths, swim, use a hot tub, etc. until the drain is removed.    Do not use any products that contain nicotine or tobacco, such as cigarettes, e-cigarettes, and chewing tobacco. If you need help quitting, ask your health care provider.   Keep all follow-up visits as told by your health care provider. This is important.   Contact a health care provider if:   You have less than 10 mL of drainage a day for 2-3 days in a row, or as directed by your health care provider.   You have any of these signs of infection:   More redness, swelling, or pain around your incision area.   More fluid or blood coming from your incision area.   Warmth coming from your incision area.   Pus or a bad smell coming from your incision area.   You have fluid leaking from around your catheter (instead of through your catheter).   You are unable to flush the drain.   You have a fever or chills.   You have pain that does not get better with medicine.   You have not been contacted to schedule a drain follow up appointment  within 10 days of discharge from the hospital.   Please call [facility] at [phone] with any questions or concerns.   Get help right away if:   Your catheter comes out.   You suddenly stop having drainage from your catheter.   You suddenly have blood in the fluid that is draining from your catheter.   You become dizzy or you faint.   You develop a rash.   You have nausea or vomiting.   You have difficulty breathing or you feel short  of breath.   You develop chest pain.   You have problems with your speech or vision.   You have trouble balancing or moving your arms or legs.   Summary   It is common to have a small amount of bruising and discomfort in the area where the drainage tube (catheter) was placed. You may also have minor discomfort with movement while the drain is in place.   Flush the drain once per day with 5 mL of 0.9% normal saline (unless you were told otherwise by your healthcare provider).    Record the amount of drainage from the bag every time you empty it.   Change the dressing every 3 days or earlier if soiled/wet. Keep the skin dry under the dressing.   You may shower with the drain in place. Do not submerge the drain (no baths, swimming, hot tubs, etc.).   Contact [facility] at [phone] if you have more redness, swelling, or pain around your incision area or if you have pain that does not get better with medicine.   This information is not intended to replace advice given to you by your health care provider. Make sure you discuss any questions you have with your health care provider.   Document Revised: 04/01/2021 Document Reviewed: 12/22/2018   Elsevier Patient Education  2023 [employer]         Interventional Radiology Drain Record   Empty your drain at least once per day. You may empty it as often as needed. Use this form to write down the amount of fluid that has collected in the drainage container. Bring  this form with you to your follow-up visits. Please call [facility] at [phone] with any questions or concerns prior to your appointment.   Drain #1 location: ___________________   Date __________ Time __________ Amount __________   Date __________ Time __________ Amount __________   Date __________ Time __________ Amount __________   Date __________ Time __________ Amount __________   Date __________ Time __________ Amount __________   Date __________ Time __________ Amount __________   Date __________ Time __________ Amount __________   Date __________ Time __________ Amount __________   Date __________ Time __________ Amount __________   Date __________ Time __________ Amount __________   Date __________ Time __________ Amount __________   Date __________ Time __________ Amount __________   Date __________ Time __________ Amount __________   Date __________ Time __________ Amount __________

## 2023-05-19 NOTE — Plan of Care (Signed)
  Problem: Fluid Volume: Goal: Hemodynamic stability will improve Outcome: Progressing   Problem: Fluid Volume: Goal: Hemodynamic stability will improve Outcome: Progressing   Problem: Clinical Measurements: Goal: Diagnostic test results will improve Outcome: Progressing Goal: Signs and symptoms of infection will decrease Outcome: Progressing   Problem: Respiratory: Goal: Ability to maintain adequate ventilation will improve Outcome: Progressing   Problem: Education: Goal: Knowledge of General Education information will improve Description: Including pain rating scale, medication(s)/side effects and non-pharmacologic comfort measures Outcome: Progressing

## 2023-05-19 NOTE — Progress Notes (Signed)
 Referring Physician(s): Burton,V  Supervising Physician: Elene Griffes  Patient Status:  [facility] - In-pt  Chief Complaint: Abdominal/pelvic pain, diverticular abscess; status post abdominal abscess drain placement on 5/7     Subjective: Pt eating lunch; plans noted for dc home today with drain; no acute changes   Allergies: Peanut butter flavoring agent (non-screening)  Medications: Prior to Admission medications   Medication Sig Start Date End Date Taking? Authorizing Provider  albuterol (VENTOLIN HFA) 108 (90 Base) MCG/ACT inhaler Inhale 1-2 puffs into the lungs every 4 (four) hours as needed for wheezing or shortness of breath. 06/28/22  Yes [provider]  EPINEPHrine 0.3 mg/0.3 mL IJ SOAJ injection Inject 0.3 mg into the muscle as needed for anaphylaxis. 11/17/21  Yes [provider]  ibuprofen (ADVIL) 200 MG tablet Take 200-800 mg by mouth every 6 (six) hours as needed for moderate pain (pain score 4-6).   Yes [provider]  levofloxacin (LEVAQUIN) 750 MG tablet Take 1 tablet (750 mg total) by mouth daily for 3 days. 05/19/23 05/22/23 Yes Rizwan, Saima, MD  OVER THE COUNTER MEDICATION Take 1 tablet by mouth daily. Allergen Tablet   Yes [provider]  Pseudoephedrine-APAP-DM (DAYQUIL MULTI-SYMPTOM PO) Take 1 Dose by mouth every 4 (four) hours as needed (Cold/cough).   Yes [provider]  amLODipine  (NORVASC ) 5 MG tablet Take 1 tablet (5 mg total) by mouth daily. 05/20/23   Rizwan, Saima, MD  metroNIDAZOLE  (FLAGYL ) 500 MG tablet Take 1 tablet (500 mg total) by mouth every 12 (twelve) hours for 3 days. 05/19/23 05/22/23  Sedalia Dacosta, MD     Vital Signs: BP (!) 159/101 (BP Location: Right Arm)   Pulse 96   Temp 98.5 F (36.9 C) (Oral)   Resp 18   Ht 5\' 6"  (1.676 m)   Wt (!) 310 lb (140.6 kg)   SpO2 97%   BMI 50.04 kg/m   Physical Exam awake/alert;   Mid lower abdominal drain intact, insertion site okay, mildly tender, output 30  cc hazy blood-tinged fluid; drain irrigated earlier without difficulty   Imaging: CT GUIDED PERITONEAL/RETROPERITONEAL FLUID DRAIN BY PERC CATH Result Date: 05/17/2023 INDICATION: MIDLINE LOWER ABDOMINAL DIVERTICULAR ABSCESS EXAM: CT DRAINAGE LOWER ABDOMINAL DIVERTICULAR ABSCESS MEDICATIONS: The patient is currently admitted to the hospital and receiving intravenous antibiotics. The antibiotics were administered within an appropriate time frame prior to the initiation of the procedure. ANESTHESIA/SEDATION: Moderate (conscious) sedation was employed during this procedure. A total of Versed  2.0 mg and Fentanyl  100 mcg was administered intravenously by the radiology nurse. Total intra-service moderate Sedation Time: 20 minutes. The patient's level of consciousness and vital signs were monitored continuously by radiology nursing throughout the procedure under my direct supervision. COMPLICATIONS: None immediate PROCEDURE: Informed written consent was obtained from the patient after a thorough discussion of the procedural risks, benefits and alternatives. All questions were addressed. Maximal Sterile Barrier Technique was utilized including caps, mask, sterile gowns, sterile gloves, sterile drape, hand hygiene and skin antiseptic. A timeout was performed prior to the initiation of the procedure. Previous imaging reviewed. Patient positioned supine. Noncontrast localization CT performed through the lower abdomen. The midline diverticular abscess was localized and marked for a left anterior oblique approach. Under sterile conditions and local anesthesia, the 18 gauge 15 cm access needle was advanced from an anterior oblique approach through the rectus musculature into the abscess. Needle position confirmed with CT. Syringe aspiration yielded purulent fluid. Sample sent culture. Guidewire inserted. Guidewire position confirmed with  CT. Tract dilatation performed to insert a 10 [place] drain. Drain catheter position  confirmed with CT. Syringe aspiration yielded 35 cc purulent fluid. This collapsed the abscess cavity. No immediate complication. Patient tolerated the procedure well. Catheter secured with a silk suture and a external suction bulb. Sterile dressing applied. Patient tolerated the procedure well. IMPRESSION: Successful CT-guided midline lower abdominal diverticular abscess drain placement as above. Electronically Signed   By: Melven Stable.  Shick M.D.   On: 05/17/2023 13:30    Labs:  CBC: Recent Labs    05/16/23 0835 05/17/23 0410 05/18/23 0402 05/19/23 0822  WBC 19.1* 19.1* 19.1* 14.8*  HGB 13.1 13.7 13.2 12.6*  HCT 42.0 43.8 42.9 40.6  PLT 467* 451* 477* 428*    COAGS: Recent Labs    05/16/23 0433  INR 1.1    BMP: Recent Labs    05/15/23 0409 05/16/23 0835 05/17/23 0410 05/18/23 0402  NA 137 137 133* 135  K 3.4* 3.5 3.2* 3.6  CL 99 98 93* 98  CO2 25 27 26 27   GLUCOSE 123* 127* 116* 128*  BUN 9 10 9 11   CALCIUM 9.1 9.2 8.7* 9.1  CREATININE 0.77 0.72 0.77 0.74  GFRNONAA >60 >60 >60 >60    LIVER FUNCTION TESTS: Recent Labs    05/14/23 0404 05/15/23 0409 05/16/23 0835 05/17/23 0410  BILITOT 0.7 0.6 0.8 0.7  AST 21 21 14* 14*  ALT 40 37 29 25  ALKPHOS 93 89 81 82  PROT 7.5 7.5 7.8 7.9  ALBUMIN 3.0* 3.0* 3.2* 3.3*    Assessment and Plan: Patient with history of diverticulitis with associated abscess; status post drain placement on 5/7; afebrile , WBC 14.8(19.1), hgb 12.6(13.2), drain fl cx pend; plans noted for pt's dc home today with drain; rec once daily flush of drain with 5 cc sterile saline, output recording and gauze dressing changes every 2-3 days; will schedule pt for IR drain clinic f/u in 10-14 days; pt aware of how to flush drain   Electronically Signed: D. Honore Lux, PA-C 05/19/2023, 1:11 PM   I spent a total of 15 Minutes at the the patient's bedside AND on the patient's hospital floor or unit, greater than 50% of which was counseling/coordinating care  for abdominal abscess drain    Patient ID: Glenn Dorsey, male   DOB: [birth year], 38 y.o.   MRN: [id number]

## 2023-05-19 NOTE — Progress Notes (Signed)
 Patient received discharge orders to go home with JP drain. Patient was given discharge paperwork/instructions and supplies to care for JP drain. RN went over discharge instructions/paperwork with the patient. All questions/concerns were addressed/answered to the best of RN's ability. RN also showed patient how to flush JP drain. Patient left the hospital stable, had discharge instructions/paperwork and supplies, and had all personal belongings.

## 2023-05-19 NOTE — Discharge Summary (Signed)
 Physician Discharge Summary  Glenn Dorsey ZHY:[id number] DOB: 1985/07/05 DOA: 05/09/2023  PCP: Bari Boos, FNP  Admit date: 05/09/2023 Discharge date: 05/19/2023 Discharging to: home Recommendations for Outpatient Follow-up:  Follow up colonoscopy in about 6 wks please Will need f/u of abscess culture CBC in 1 wk please Encourage weight loss, dietary changes F/u BP  Consults:  IR Gen surgery     Discharge Diagnoses:   Principal Problem:   Sepsis (HCC) Active Problems:   Abscess of sigmoid colon due to diverticulitis   Hypokalemia   Diverticulitis   Benign essential HTN   Obesity, class 3     Hospital Course:  38 year old male with morbid obesity presented for lower abdominal pain, nausea and fever and found to have acute diverticulitis of the sigmoid colon with pericolonic stranding and abscess measuring 3.1 cm on the CT with contrast. Started on IV antibiotics, IVF. IR and Gen surgery consulted.   Principal Problem:   Sepsis related to diverticular abscess - Ceftriaxone , Flagyl  and IV fluids - 5/7 Perc drain LLQ into abscess - Gram-positive cocci in chains and WBC noted on Gram stain- culture still pending - WBC 19> 14.8 - 30 cc output over past 24 hrs - Gen surgery has switched him to Cipro and Flagyl  today- he has taken fist dose today and is tolerating it- today is day 11 of antibiotics - I have touched base with Dr Camilo Cella (gen surg) who is recommending a total of 14 days of antibiotics  - will dc home with drain - outpt f/u with IR and Gen surgery - Gen surg sent referral to [facility] for colonoscopy in ~ 6 wks- Also given number for [facility]   Active Problems: Hypokalemia - 3.2-has improved to 3.6 - Magnesium is normal   Thrombocytosis - May be secondary to acute infection-follow-up   Obesity class III Estimated body mass index is 50.04 kg/m as calculated from the following:   Height as of this encounter: 5\' 6"  (1.676 m).   Weight as of this  encounter: 140.6 kg.  Essential HTN - started on Amlodipine - will need titration as outpt - dc Pseudophed    Discharge Instructions  Discharge Instructions     Ambulatory referral to Gastroenterology   Complete by: As directed    Sigmoid diverticulitis w/ abscess Requesting colonoscopy in ~6 weeks.   What is the reason for referral?: Colonoscopy   Diet - low sodium heart healthy   Complete by: As directed    Low sodium, low fat and , for now, low fiber   Discharge wound care:   Complete by: As directed    Please flush drain QD with 5 cc NS, record output QD, dressing changes every 2-3 days or earlier if soiled.   Increase activity slowly   Complete by: As directed       Allergies as of 05/19/2023       Reactions   Peanut Butter Flavoring Agent (non-screening) Anaphylaxis        Medication List     STOP taking these medications    DAYQUIL MULTI-SYMPTOM PO   ibuprofen 200 MG tablet Commonly known as: ADVIL       TAKE these medications    albuterol 108 (90 Base) MCG/ACT inhaler Commonly known as: VENTOLIN HFA Inhale 1-2 puffs into the lungs every 4 (four) hours as needed for wheezing or shortness of breath.   amLODipine  5 MG tablet Commonly known as: NORVASC  Take 1 tablet (5 mg total) by mouth daily.  Start taking on: May 20, 2023   ciprofloxacin 750 MG tablet Commonly known as: CIPRO Take 1 tablet (750 mg total) by mouth 2 (two) times daily for 3 days.   EPINEPHrine 0.3 mg/0.3 mL Soaj injection Commonly known as: EPI-PEN Inject 0.3 mg into the muscle as needed for anaphylaxis.   metroNIDAZOLE  500 MG tablet Commonly known as: FLAGYL  Take 1 tablet (500 mg total) by mouth every 12 (twelve) hours for 3 days.   OVER THE COUNTER MEDICATION Take 1 tablet by mouth daily. Allergen Tablet               Discharge Care Instructions  (From admission, onward)           Start     Ordered   05/19/23 0000  Discharge wound care:       Comments:  Please flush drain QD with 5 cc NS, record output QD, dressing changes every 2-3 days or earlier if soiled.   05/19/23 1127            Follow-up Information     Lucinda Saber, MD Follow up.   Specialties: Interventional Radiology, Radiology Why: For follow up of your IR drain Contact information: [address] [place] [address] [phone]         Melvenia Stabs, MD Follow up on 06/13/2023.   Specialties: General Surgery, Colon and Rectal Surgery Why: 1050am. Please arrive 30 minutes prior to your appointment for paperwork. Please bring a copy of your photo ID and insurance card. Contact information: [address] [place] [address] [phone]         Bari Boos, FNP. Schedule an appointment as soon as possible for a visit in 1 week(s).   Specialty: Nurse Practitioner Contact information: [address] [place] [address] [phone]         [facility]. Schedule an appointment as soon as possible for a visit.   Specialty: Gastroenterology Contact information: [address] [place]  [address] [phone]                   The results of significant diagnostics from this hospitalization (including imaging, microbiology, ancillary and laboratory) are listed below for reference.    CT GUIDED PERITONEAL/RETROPERITONEAL FLUID DRAIN BY PERC CATH Result Date: 05/17/2023 INDICATION: MIDLINE LOWER ABDOMINAL DIVERTICULAR ABSCESS EXAM: CT DRAINAGE LOWER ABDOMINAL DIVERTICULAR ABSCESS MEDICATIONS: The patient is currently admitted to the hospital and receiving intravenous antibiotics. The antibiotics were administered within an appropriate time frame prior to the initiation of the procedure. ANESTHESIA/SEDATION: Moderate (conscious) sedation was employed during this procedure. A total of Versed  2.0 mg and Fentanyl  100 mcg was administered  intravenously by the radiology nurse. Total intra-service moderate Sedation Time: 20 minutes. The patient's level of consciousness and vital signs were monitored continuously by radiology nursing throughout the procedure under my direct supervision. COMPLICATIONS: None immediate PROCEDURE: Informed written consent was obtained from the patient after a thorough discussion of the procedural risks, benefits and alternatives. All questions were addressed. Maximal Sterile Barrier Technique was utilized including caps, mask, sterile gowns, sterile gloves, sterile drape, hand hygiene and skin antiseptic. A timeout was performed prior to the initiation of the procedure. Previous imaging reviewed. Patient positioned supine. Noncontrast localization CT performed through the lower abdomen. The midline diverticular abscess was localized and marked for a left anterior oblique approach. Under sterile conditions and local anesthesia, the 18 gauge 15  cm access needle was advanced from an anterior oblique approach through the rectus musculature into the abscess. Needle position confirmed with CT. Syringe aspiration yielded purulent fluid. Sample sent culture. Guidewire inserted. Guidewire position confirmed with CT. Tract dilatation performed to insert a 10 [place] drain. Drain catheter position confirmed with CT. Syringe aspiration yielded 35 cc purulent fluid. This collapsed the abscess cavity. No immediate complication. Patient tolerated the procedure well. Catheter secured with a silk suture and a external suction bulb. Sterile dressing applied. Patient tolerated the procedure well. IMPRESSION: Successful CT-guided midline lower abdominal diverticular abscess drain placement as above. Electronically Signed   By: Melven Stable.  Shick M.D.   On: 05/17/2023 13:30   CT ABDOMEN PELVIS W CONTRAST Result Date: 05/14/2023 CLINICAL DATA:  Left lower quadrant pain EXAM: CT ABDOMEN AND PELVIS WITH CONTRAST TECHNIQUE: Multidetector CT imaging of the  abdomen and pelvis was performed using the standard protocol following bolus administration of intravenous contrast. RADIATION DOSE REDUCTION: This exam was performed according to the departmental dose-optimization program which includes automated exposure control, adjustment of the mA and/or kV according to patient size and/or use of iterative reconstruction technique. CONTRAST:  OMNIPAQUE  IOHEXOL  300 MG/ML  SOLN COMPARISON:  CT abdomen and pelvis 05/09/2023. FINDINGS: Lower chest: No acute abnormality. Hepatobiliary: No focal liver abnormality is seen. No gallstones, gallbladder wall thickening, or biliary dilatation. Pancreas: Unremarkable. No pancreatic ductal dilatation or surrounding inflammatory changes. Spleen: Normal in size without focal abnormality. Adrenals/Urinary Tract: The kidneys and adrenal glands are within normal limits. There is mild wall thickening and inflammatory stranding of the superior bladder wall. The bladder is otherwise within normal limits. Stomach/Bowel: Again seen is sigmoid colon diverticulosis with wall thickening and surrounding inflammation compatible with acute diverticulitis. Adjacent to the mid sigmoid colon in the midline pelvis there is enhance sing fluid collection which has increased in size measuring 3.9 x 2.8 by 5.7 cm. There is marked inflammatory stranding surrounding this fluid collection. There also small foci of free air in the mesentery at this level. There is no bowel obstruction. The appendix, stomach, and small bowel loops appear within normal limits. Vascular/Lymphatic: No significant vascular findings are present. No enlarged abdominal or pelvic lymph nodes. Reproductive: Prostate is unremarkable. Other: No abdominal wall hernia or abnormality. No abdominopelvic ascites. Musculoskeletal: No acute or significant osseous findings. IMPRESSION: 1. Acute sigmoid colon diverticulitis with interval increase in size of pericolonic abscess measuring up to 5.7 cm.  2. Small foci of free air in the mesentery at the level of the abscess compatible with perforation. 3. Mild wall thickening and inflammatory stranding of the superior bladder wall may be reactive or related to cystitis. Electronically Signed   By: Tyron Gallon M.D.   On: 05/14/2023 18:19   CT ABDOMEN PELVIS W CONTRAST Result Date: 05/09/2023 CLINICAL DATA:  Sepsis. Lower abdominal pain starting a few days ago. Nausea. Loose stools. Dysuria for 2 days. EXAM: CT ABDOMEN AND PELVIS WITH CONTRAST TECHNIQUE: Multidetector CT imaging of the abdomen and pelvis was performed using the standard protocol following bolus administration of intravenous contrast. RADIATION DOSE REDUCTION: This exam was performed according to the departmental dose-optimization program which includes automated exposure control, adjustment of the mA and/or kV according to patient size and/or use of iterative reconstruction technique. CONTRAST:  OMNIPAQUE  IOHEXOL  300 MG/ML  SOLN COMPARISON:  Chest radiograph 05/09/2023 FINDINGS: Lower chest: Lung bases are clear. Hepatobiliary: Mild diffuse fatty infiltration of the liver. No focal liver lesions. Gallbladder and bile ducts are  normal. Pancreas: Unremarkable. No pancreatic ductal dilatation or surrounding inflammatory changes. Spleen: Normal in size without focal abnormality. Adrenals/Urinary Tract: Adrenal glands are unremarkable. Kidneys are normal, without renal calculi, focal lesion, or hydronephrosis. Bladder is unremarkable. Stomach/Bowel: Stomach, small bowel, and colon are mostly decompressed. Diverticulosis of the sigmoid colon. There is infiltration in the pelvic fat around the sigmoid colon. Within the sigmoid C-loop, there is an abscess with surrounding fat stranding. The abscess measures 3.1 cm diameter. Adjacent sigmoid colonic wall thickening. Changes are consistent with acute diverticulitis with pericolonic abscess. The appendix is normal. Vascular/Lymphatic: No significant  vascular findings are present. No enlarged abdominal or pelvic lymph nodes. Reproductive: Prostate is unremarkable. Other: No free air in the abdomen. No free fluid. Abdominal wall musculature appears intact. Musculoskeletal: No acute or significant osseous findings. IMPRESSION: 1. Colonic diverticulosis. Acute diverticulitis of the sigmoid colon with pericolonic stranding and abscess measuring 3.1 cm diameter. 2. No evidence of bowel obstruction.  No free air. Electronically Signed   By: Boyce Byes M.D.   On: 05/09/2023 22:16   DG Chest Portable 1 View Result Date: 05/09/2023 CLINICAL DATA:  Lower abdominal pain.  Sepsis. EXAM: PORTABLE CHEST 1 VIEW COMPARISON:  February 21, 2013. FINDINGS: The heart size and mediastinal contours are within normal limits. Both lungs are clear. The visualized skeletal structures are unremarkable. IMPRESSION: No active disease. Electronically Signed   By: Rosalene Colon M.D.   On: 05/09/2023 17:56   Labs:   Basic Metabolic Panel: Recent Labs  Lab 05/13/23 0446 05/14/23 0404 05/15/23 0409 05/16/23 0835 05/17/23 0410 05/18/23 0402  NA 136 138 137 137 133* 135  K 3.3* 3.5 3.4* 3.5 3.2* 3.6  CL 98 100 99 98 93* 98  CO2 26 28 25 27 26 27   GLUCOSE 112* 141* 123* 127* 116* 128*  BUN 9 9 9 10 9 11   CREATININE 0.65 0.67 0.77 0.72 0.77 0.74  CALCIUM 9.0 9.0 9.1 9.2 8.7* 9.1  MG 2.5* 2.4 2.3  --  2.5*  --   PHOS 5.4* 4.5 4.9*  --  5.0*  --      CBC: Recent Labs  Lab 05/13/23 0446 05/14/23 0404 05/15/23 0409 05/16/23 0835 05/17/23 0410 05/18/23 0402 05/19/23 0822  WBC 20.0* 19.7* 17.7* 19.1* 19.1* 19.1* 14.8*  NEUTROABS 14.7* 14.9* 13.0* 15.2*  --   --   --   HGB 13.5 13.3 13.8 13.1 13.7 13.2 12.6*  HCT 45.1 44.3 45.5 42.0 43.8 42.9 40.6  MCV 87.4 87.4 86.2 84.7 84.7 85.1 85.7  PLT 421* 453* 424* 467* 451* 477* 428*         SIGNED:   Sedalia Dacosta, MD  [facility] 05/19/2023, 11:35 AM Time taking on discharge: 50 minutes

## 2023-05-19 NOTE — Progress Notes (Signed)
 Subjective: No acute changes - denies any abdominal pain, +flatus and now 3 BM. Reports non-bloody stools. Ate >75% of breakfast.    Objective: Vital signs in last 24 hours: Temp:  [98.1 F (36.7 C)-99 F (37.2 C)] 98.1 F (36.7 C) (05/09 0557) Pulse Rate:  [95-103] 95 (05/09 0557) Resp:  [18-19] 18 (05/09 0557) BP: (137-162)/(86-97) 148/96 (05/09 0557) SpO2:  [97 %-98 %] 98 % (05/09 0557) Last BM Date : 05/18/23  Intake/Output from previous day: 05/08 0701 - 05/09 0700 In: 898.1 [P.O.:360; I.V.:10; NG/GT:325; IV Piggyback:198.1] Out: 30 [Drains:30] Intake/Output this shift: No intake/output data recorded.  PE: Gen:  Alert, NAD, pleasant Abd: Soft, ND,minimally tender around JP drain which is draining bloody, purulent fluid (65 mL documented since placement yesterday) Psych: A&Ox3  Lab Results:  Recent Labs    05/18/23 0402 05/19/23 0822  WBC 19.1* 14.8*  HGB 13.2 12.6*  HCT 42.9 40.6  PLT 477* 428*   BMET Recent Labs    05/17/23 0410 05/18/23 0402  NA 133* 135  K 3.2* 3.6  CL 93* 98  CO2 26 27  GLUCOSE 116* 128*  BUN 9 11  CREATININE 0.77 0.74  CALCIUM 8.7* 9.1   PT/INR No results for input(s): "LABPROT", "INR" in the last 72 hours.  CMP     Component Value Date/Time   NA 135 05/18/2023 0402   K 3.6 05/18/2023 0402   CL 98 05/18/2023 0402   CO2 27 05/18/2023 0402   GLUCOSE 128 (H) 05/18/2023 0402   BUN 11 05/18/2023 0402   CREATININE 0.74 05/18/2023 0402   CALCIUM 9.1 05/18/2023 0402   PROT 7.9 05/17/2023 0410   ALBUMIN 3.3 (L) 05/17/2023 0410   AST 14 (L) 05/17/2023 0410   ALT 25 05/17/2023 0410   ALKPHOS 82 05/17/2023 0410   BILITOT 0.7 05/17/2023 0410   GFRNONAA >60 05/18/2023 0402   Lipase  No results found for: "LIPASE"  Studies/Results: CT GUIDED PERITONEAL/RETROPERITONEAL FLUID DRAIN BY PERC CATH Result Date: 05/17/2023 INDICATION: MIDLINE LOWER ABDOMINAL DIVERTICULAR ABSCESS EXAM: CT DRAINAGE LOWER ABDOMINAL DIVERTICULAR  ABSCESS MEDICATIONS: The patient is currently admitted to the hospital and receiving intravenous antibiotics. The antibiotics were administered within an appropriate time frame prior to the initiation of the procedure. ANESTHESIA/SEDATION: Moderate (conscious) sedation was employed during this procedure. A total of Versed  2.0 mg and Fentanyl  100 mcg was administered intravenously by the radiology nurse. Total intra-service moderate Sedation Time: 20 minutes. The patient's level of consciousness and vital signs were monitored continuously by radiology nursing throughout the procedure under my direct supervision. COMPLICATIONS: None immediate PROCEDURE: Informed written consent was obtained from the patient after a thorough discussion of the procedural risks, benefits and alternatives. All questions were addressed. Maximal Sterile Barrier Technique was utilized including caps, mask, sterile gowns, sterile gloves, sterile drape, hand hygiene and skin antiseptic. A timeout was performed prior to the initiation of the procedure. Previous imaging reviewed. Patient positioned supine. Noncontrast localization CT performed through the lower abdomen. The midline diverticular abscess was localized and marked for a left anterior oblique approach. Under sterile conditions and local anesthesia, the 18 gauge 15 cm access needle was advanced from an anterior oblique approach through the rectus musculature into the abscess. Needle position confirmed with CT. Syringe aspiration yielded purulent fluid. Sample sent culture. Guidewire inserted. Guidewire position confirmed with CT. Tract dilatation performed to insert a 10 [place] drain. Drain catheter position confirmed with CT. Syringe aspiration yielded 35 cc purulent  fluid. This collapsed the abscess cavity. No immediate complication. Patient tolerated the procedure well. Catheter secured with a silk suture and a external suction bulb. Sterile dressing applied. Patient tolerated the  procedure well. IMPRESSION: Successful CT-guided midline lower abdominal diverticular abscess drain placement as above. Electronically Signed   By: Melven Stable.  Shick M.D.   On: 05/17/2023 13:30     Anti-infectives: Anti-infectives (From admission, onward)    Start     Dose/Rate Route Frequency Ordered Stop   05/09/23 2230  metroNIDAZOLE  (FLAGYL ) IVPB 500 mg        500 mg 100 mL/hr over 60 Minutes Intravenous Every 12 hours 05/09/23 2220     05/09/23 1745  cefTAZidime (FORTAZ) 2 g in sodium chloride  0.9 % 100 mL IVPB  Status:  Discontinued        2 g 200 mL/hr over 30 Minutes Intravenous  Once 05/09/23 1735 05/09/23 1735   05/09/23 1745  cefTRIAXone  (ROCEPHIN ) 2 g in sodium chloride  0.9 % 100 mL IVPB        2 g 200 mL/hr over 30 Minutes Intravenous Every 24 hours 05/09/23 1736          Assessment/Plan Sigmoid diverticulitis w/ abscess - No current indication for emergency surgery - Repeat CT 5/4 w/ sigmoid colon diverticulitis with 5.7 pericolonic abscess and small foci of free air in the mesentery at the level of the abscess, s/p IR drainage 5/7 Dr. Lovell Rubenstein - Transition to PO cipro/flagyl  - he is currently clinically improving with non-operative measures. If his cultures finalize to help direct PO abx, and if his HR and WBC are stable/improved, he could potentially be discharged home as early as tomorrow 5/9 on PO abx. CCS will follow. - If patient improves with conservative therapies would recommend colonoscopy in ~6-8 weeks.     FEN - Reg VTE - SCDs, Lovenox   ID - Rocephin /Flagyl  - can place on PO abx to complete 10 day course - Cipro/flagyl . WBC 14.8 from 19  Dispo - if tolerating PO abx and feeling well, ok for discharge later today. Can follow-up with IR for drain study and additionally in our office for re-check. Would recommend Referral to GI for outpatient colonoscopy to be done in ~6 weeks or so if possible and doing well  I reviewed nursing notes, hospitalist notes, last 24 h  vitals and pain scores, last 48 h intake and output, last 24 h labs and trends, and last 24 h imaging results.   LOS: 10 days   I spent a total of 35 minutes in both face-to-face and non-face-to-face activities, excluding procedures performed, for this visit on the date of this encounter.  Beatris Lincoln, MD [facility], [facility]

## 2023-05-19 NOTE — Discharge Summary (Incomplete Revision)
 Physician Discharge Summary  Glenn Dorsey ZHY:[id number] DOB: 1985/07/05 DOA: 05/09/2023  PCP: Bari Boos, FNP  Admit date: 05/09/2023 Discharge date: 05/19/2023 Discharging to: home Recommendations for Outpatient Follow-up:  Follow up colonoscopy in about 6 wks please Will need f/u of abscess culture CBC in 1 wk please Encourage weight loss, dietary changes F/u BP  Consults:  IR Gen surgery     Discharge Diagnoses:   Principal Problem:   Sepsis (HCC) Active Problems:   Abscess of sigmoid colon due to diverticulitis   Hypokalemia   Diverticulitis   Benign essential HTN   Obesity, class 3     Hospital Course:  38 year old male with morbid obesity presented for lower abdominal pain, nausea and fever and found to have acute diverticulitis of the sigmoid colon with pericolonic stranding and abscess measuring 3.1 cm on the CT with contrast. Started on IV antibiotics, IVF. IR and Gen surgery consulted.   Principal Problem:   Sepsis related to diverticular abscess - Ceftriaxone , Flagyl  and IV fluids - 5/7 Perc drain LLQ into abscess - Gram-positive cocci in chains and WBC noted on Gram stain- culture still pending - WBC 19> 14.8 - 30 cc output over past 24 hrs - Gen surgery has switched him to Cipro and Flagyl  today- he has taken fist dose today and is tolerating it- today is day 11 of antibiotics - I have touched base with Dr Camilo Cella (gen surg) who is recommending a total of 14 days of antibiotics  - will dc home with drain - outpt f/u with IR and Gen surgery - Gen surg sent referral to [facility] for colonoscopy in ~ 6 wks- Also given number for [facility]   Active Problems: Hypokalemia - 3.2-has improved to 3.6 - Magnesium is normal   Thrombocytosis - May be secondary to acute infection-follow-up   Obesity class III Estimated body mass index is 50.04 kg/m as calculated from the following:   Height as of this encounter: 5\' 6"  (1.676 m).   Weight as of this  encounter: 140.6 kg.  Essential HTN - started on Amlodipine - will need titration as outpt - dc Pseudophed    Discharge Instructions  Discharge Instructions     Ambulatory referral to Gastroenterology   Complete by: As directed    Sigmoid diverticulitis w/ abscess Requesting colonoscopy in ~6 weeks.   What is the reason for referral?: Colonoscopy   Diet - low sodium heart healthy   Complete by: As directed    Low sodium, low fat and , for now, low fiber   Discharge wound care:   Complete by: As directed    Please flush drain QD with 5 cc NS, record output QD, dressing changes every 2-3 days or earlier if soiled.   Increase activity slowly   Complete by: As directed       Allergies as of 05/19/2023       Reactions   Peanut Butter Flavoring Agent (non-screening) Anaphylaxis        Medication List     STOP taking these medications    DAYQUIL MULTI-SYMPTOM PO   ibuprofen 200 MG tablet Commonly known as: ADVIL       TAKE these medications    albuterol 108 (90 Base) MCG/ACT inhaler Commonly known as: VENTOLIN HFA Inhale 1-2 puffs into the lungs every 4 (four) hours as needed for wheezing or shortness of breath.   amLODipine  5 MG tablet Commonly known as: NORVASC  Take 1 tablet (5 mg total) by mouth daily.  Start taking on: May 20, 2023   ciprofloxacin 750 MG tablet Commonly known as: CIPRO Take 1 tablet (750 mg total) by mouth 2 (two) times daily for 3 days.   EPINEPHrine 0.3 mg/0.3 mL Soaj injection Commonly known as: EPI-PEN Inject 0.3 mg into the muscle as needed for anaphylaxis.   metroNIDAZOLE  500 MG tablet Commonly known as: FLAGYL  Take 1 tablet (500 mg total) by mouth every 12 (twelve) hours for 3 days.   OVER THE COUNTER MEDICATION Take 1 tablet by mouth daily. Allergen Tablet               Discharge Care Instructions  (From admission, onward)           Start     Ordered   05/19/23 0000  Discharge wound care:       Comments:  Please flush drain QD with 5 cc NS, record output QD, dressing changes every 2-3 days or earlier if soiled.   05/19/23 1127            Follow-up Information     Lucinda Saber, MD Follow up.   Specialties: Interventional Radiology, Radiology Why: For follow up of your IR drain Contact information: [address] [place] [address] [phone]         Melvenia Stabs, MD Follow up on 06/13/2023.   Specialties: General Surgery, Colon and Rectal Surgery Why: 1050am. Please arrive 30 minutes prior to your appointment for paperwork. Please bring a copy of your photo ID and insurance card. Contact information: [address] [place] [address] [phone]         Bari Boos, FNP. Schedule an appointment as soon as possible for a visit in 1 week(s).   Specialty: Nurse Practitioner Contact information: [address] [place] [address] [phone]         [facility]. Schedule an appointment as soon as possible for a visit.   Specialty: Gastroenterology Contact information: [address] [place]  [address] [phone]                   The results of significant diagnostics from this hospitalization (including imaging, microbiology, ancillary and laboratory) are listed below for reference.    CT GUIDED PERITONEAL/RETROPERITONEAL FLUID DRAIN BY PERC CATH Result Date: 05/17/2023 INDICATION: MIDLINE LOWER ABDOMINAL DIVERTICULAR ABSCESS EXAM: CT DRAINAGE LOWER ABDOMINAL DIVERTICULAR ABSCESS MEDICATIONS: The patient is currently admitted to the hospital and receiving intravenous antibiotics. The antibiotics were administered within an appropriate time frame prior to the initiation of the procedure. ANESTHESIA/SEDATION: Moderate (conscious) sedation was employed during this procedure. A total of Versed  2.0 mg and Fentanyl  100 mcg was administered  intravenously by the radiology nurse. Total intra-service moderate Sedation Time: 20 minutes. The patient's level of consciousness and vital signs were monitored continuously by radiology nursing throughout the procedure under my direct supervision. COMPLICATIONS: None immediate PROCEDURE: Informed written consent was obtained from the patient after a thorough discussion of the procedural risks, benefits and alternatives. All questions were addressed. Maximal Sterile Barrier Technique was utilized including caps, mask, sterile gowns, sterile gloves, sterile drape, hand hygiene and skin antiseptic. A timeout was performed prior to the initiation of the procedure. Previous imaging reviewed. Patient positioned supine. Noncontrast localization CT performed through the lower abdomen. The midline diverticular abscess was localized and marked for a left anterior oblique approach. Under sterile conditions and local anesthesia, the 18 gauge 15  cm access needle was advanced from an anterior oblique approach through the rectus musculature into the abscess. Needle position confirmed with CT. Syringe aspiration yielded purulent fluid. Sample sent culture. Guidewire inserted. Guidewire position confirmed with CT. Tract dilatation performed to insert a 10 [place] drain. Drain catheter position confirmed with CT. Syringe aspiration yielded 35 cc purulent fluid. This collapsed the abscess cavity. No immediate complication. Patient tolerated the procedure well. Catheter secured with a silk suture and a external suction bulb. Sterile dressing applied. Patient tolerated the procedure well. IMPRESSION: Successful CT-guided midline lower abdominal diverticular abscess drain placement as above. Electronically Signed   By: Melven Stable.  Shick M.D.   On: 05/17/2023 13:30   CT ABDOMEN PELVIS W CONTRAST Result Date: 05/14/2023 CLINICAL DATA:  Left lower quadrant pain EXAM: CT ABDOMEN AND PELVIS WITH CONTRAST TECHNIQUE: Multidetector CT imaging of the  abdomen and pelvis was performed using the standard protocol following bolus administration of intravenous contrast. RADIATION DOSE REDUCTION: This exam was performed according to the departmental dose-optimization program which includes automated exposure control, adjustment of the mA and/or kV according to patient size and/or use of iterative reconstruction technique. CONTRAST:  OMNIPAQUE  IOHEXOL  300 MG/ML  SOLN COMPARISON:  CT abdomen and pelvis 05/09/2023. FINDINGS: Lower chest: No acute abnormality. Hepatobiliary: No focal liver abnormality is seen. No gallstones, gallbladder wall thickening, or biliary dilatation. Pancreas: Unremarkable. No pancreatic ductal dilatation or surrounding inflammatory changes. Spleen: Normal in size without focal abnormality. Adrenals/Urinary Tract: The kidneys and adrenal glands are within normal limits. There is mild wall thickening and inflammatory stranding of the superior bladder wall. The bladder is otherwise within normal limits. Stomach/Bowel: Again seen is sigmoid colon diverticulosis with wall thickening and surrounding inflammation compatible with acute diverticulitis. Adjacent to the mid sigmoid colon in the midline pelvis there is enhance sing fluid collection which has increased in size measuring 3.9 x 2.8 by 5.7 cm. There is marked inflammatory stranding surrounding this fluid collection. There also small foci of free air in the mesentery at this level. There is no bowel obstruction. The appendix, stomach, and small bowel loops appear within normal limits. Vascular/Lymphatic: No significant vascular findings are present. No enlarged abdominal or pelvic lymph nodes. Reproductive: Prostate is unremarkable. Other: No abdominal wall hernia or abnormality. No abdominopelvic ascites. Musculoskeletal: No acute or significant osseous findings. IMPRESSION: 1. Acute sigmoid colon diverticulitis with interval increase in size of pericolonic abscess measuring up to 5.7 cm.  2. Small foci of free air in the mesentery at the level of the abscess compatible with perforation. 3. Mild wall thickening and inflammatory stranding of the superior bladder wall may be reactive or related to cystitis. Electronically Signed   By: Tyron Gallon M.D.   On: 05/14/2023 18:19   CT ABDOMEN PELVIS W CONTRAST Result Date: 05/09/2023 CLINICAL DATA:  Sepsis. Lower abdominal pain starting a few days ago. Nausea. Loose stools. Dysuria for 2 days. EXAM: CT ABDOMEN AND PELVIS WITH CONTRAST TECHNIQUE: Multidetector CT imaging of the abdomen and pelvis was performed using the standard protocol following bolus administration of intravenous contrast. RADIATION DOSE REDUCTION: This exam was performed according to the departmental dose-optimization program which includes automated exposure control, adjustment of the mA and/or kV according to patient size and/or use of iterative reconstruction technique. CONTRAST:  OMNIPAQUE  IOHEXOL  300 MG/ML  SOLN COMPARISON:  Chest radiograph 05/09/2023 FINDINGS: Lower chest: Lung bases are clear. Hepatobiliary: Mild diffuse fatty infiltration of the liver. No focal liver lesions. Gallbladder and bile ducts are  normal. Pancreas: Unremarkable. No pancreatic ductal dilatation or surrounding inflammatory changes. Spleen: Normal in size without focal abnormality. Adrenals/Urinary Tract: Adrenal glands are unremarkable. Kidneys are normal, without renal calculi, focal lesion, or hydronephrosis. Bladder is unremarkable. Stomach/Bowel: Stomach, small bowel, and colon are mostly decompressed. Diverticulosis of the sigmoid colon. There is infiltration in the pelvic fat around the sigmoid colon. Within the sigmoid C-loop, there is an abscess with surrounding fat stranding. The abscess measures 3.1 cm diameter. Adjacent sigmoid colonic wall thickening. Changes are consistent with acute diverticulitis with pericolonic abscess. The appendix is normal. Vascular/Lymphatic: No significant  vascular findings are present. No enlarged abdominal or pelvic lymph nodes. Reproductive: Prostate is unremarkable. Other: No free air in the abdomen. No free fluid. Abdominal wall musculature appears intact. Musculoskeletal: No acute or significant osseous findings. IMPRESSION: 1. Colonic diverticulosis. Acute diverticulitis of the sigmoid colon with pericolonic stranding and abscess measuring 3.1 cm diameter. 2. No evidence of bowel obstruction.  No free air. Electronically Signed   By: Boyce Byes M.D.   On: 05/09/2023 22:16   DG Chest Portable 1 View Result Date: 05/09/2023 CLINICAL DATA:  Lower abdominal pain.  Sepsis. EXAM: PORTABLE CHEST 1 VIEW COMPARISON:  February 21, 2013. FINDINGS: The heart size and mediastinal contours are within normal limits. Both lungs are clear. The visualized skeletal structures are unremarkable. IMPRESSION: No active disease. Electronically Signed   By: Rosalene Colon M.D.   On: 05/09/2023 17:56   Labs:   Basic Metabolic Panel: Recent Labs  Lab 05/13/23 0446 05/14/23 0404 05/15/23 0409 05/16/23 0835 05/17/23 0410 05/18/23 0402  NA 136 138 137 137 133* 135  K 3.3* 3.5 3.4* 3.5 3.2* 3.6  CL 98 100 99 98 93* 98  CO2 26 28 25 27 26 27   GLUCOSE 112* 141* 123* 127* 116* 128*  BUN 9 9 9 10 9 11   CREATININE 0.65 0.67 0.77 0.72 0.77 0.74  CALCIUM 9.0 9.0 9.1 9.2 8.7* 9.1  MG 2.5* 2.4 2.3  --  2.5*  --   PHOS 5.4* 4.5 4.9*  --  5.0*  --      CBC: Recent Labs  Lab 05/13/23 0446 05/14/23 0404 05/15/23 0409 05/16/23 0835 05/17/23 0410 05/18/23 0402 05/19/23 0822  WBC 20.0* 19.7* 17.7* 19.1* 19.1* 19.1* 14.8*  NEUTROABS 14.7* 14.9* 13.0* 15.2*  --   --   --   HGB 13.5 13.3 13.8 13.1 13.7 13.2 12.6*  HCT 45.1 44.3 45.5 42.0 43.8 42.9 40.6  MCV 87.4 87.4 86.2 84.7 84.7 85.1 85.7  PLT 421* 453* 424* 467* 451* 477* 428*         SIGNED:   Sedalia Dacosta, MD  [facility] 05/19/2023, 11:35 AM Time taking on discharge: 50 minutes

## 2023-05-22 ENCOUNTER — Other Ambulatory Visit: Payer: Self-pay | Admitting: Surgery

## 2023-05-22 DIAGNOSIS — K5720 Diverticulitis of large intestine with perforation and abscess without bleeding: Secondary | ICD-10-CM

## 2023-05-22 LAB — AEROBIC/ANAEROBIC CULTURE W GRAM STAIN (SURGICAL/DEEP WOUND)

## 2023-05-31 ENCOUNTER — Other Ambulatory Visit (HOSPITAL_COMMUNITY): Payer: Self-pay | Admitting: Radiology

## 2023-05-31 ENCOUNTER — Ambulatory Visit
Admission: RE | Admit: 2023-05-31 | Discharge: 2023-05-31 | Disposition: A | Discharge status: Home / Self Care | Payer: Self-pay | Source: Ambulatory Visit | Attending: Surgery | Admitting: Surgery

## 2023-05-31 ENCOUNTER — Ambulatory Visit
Admission: RE | Admit: 2023-05-31 | Discharge: 2023-05-31 | Disposition: A | Discharge status: Home / Self Care | Payer: Self-pay | Source: Ambulatory Visit | Attending: Radiology | Admitting: Radiology

## 2023-05-31 ENCOUNTER — Other Ambulatory Visit: Payer: Self-pay | Admitting: Surgery

## 2023-05-31 DIAGNOSIS — K5720 Diverticulitis of large intestine with perforation and abscess without bleeding: Secondary | ICD-10-CM

## 2023-05-31 HISTORY — PX: IR RADIOLOGIST EVAL & MGMT: IMG5224

## 2023-05-31 MED ORDER — IOPAMIDOL (ISOVUE-300) INJECTION 61%
100.0000 mL | Freq: Once | INTRAVENOUS | Status: AC | PRN
  Administered 2023-05-31: 100 mL via INTRAVENOUS

## 2023-05-31 NOTE — Progress Notes (Addendum)
 Chief Complaint: Patient was seen in consultation today for Lower abdominal pain/diverticular abscess   at the request of White,Christopher M  Referring Physician(s): White,Christopher M  Supervising Physician: Myrlene Asper  History of Present Illness: Glenn Dorsey is a 38 y.o. male with history of asthma, prediabetes, obesity and diverticular abscess-s/p drain placement 05/17/23 with Dr. Lovell Rubenstein. Patient reports flushing the drain once daily. He is not currently on antibiotics. There is 8-25 mL output in the bulb daily. Denies: chest pain, shortness of breath, nausea, vomiting, abdominal pain, fever, and/or diarrhea.    Past Medical History:  Diagnosis Date   Asthma    not since early 20's   Pre-diabetes     Past Surgical History:  Procedure Laterality Date   GANGLION CYST EXCISION Left 08/10/2022   Procedure: Excision of left volar carpal ganglion cyst;  Surgeon: Marilyn Shropshire, MD;  Location: [facility] OR;  Service: Orthopedics;  Laterality: Left;  regional 45   NO PAST SURGERIES      Allergies: Peanut butter flavoring agent (non-screening)  Medications: Prior to Admission medications   Medication Sig Start Date End Date Taking? Authorizing Provider  albuterol (VENTOLIN HFA) 108 (90 Base) MCG/ACT inhaler Inhale 1-2 puffs into the lungs every 4 (four) hours as needed for wheezing or shortness of breath. 06/28/22   [provider]  amLODipine  (NORVASC ) 5 MG tablet Take 1 tablet (5 mg total) by mouth daily. 05/20/23   Rizwan, Saima, MD  EPINEPHrine 0.3 mg/0.3 mL IJ SOAJ injection Inject 0.3 mg into the muscle as needed for anaphylaxis. 11/17/21   [provider]  OVER THE COUNTER MEDICATION Take 1 tablet by mouth daily. Allergen Tablet    [provider]     Family History  Problem Relation Age of Onset   Diabetes Mother    Diabetes Father     Social History   Socioeconomic History   Marital status: Single    Spouse name: Not on file    Number of children: Not on file   Years of education: Not on file   Highest education level: Not on file  Occupational History   Not on file  Tobacco Use   Smoking status: Former    Types: Cigarettes   Smokeless tobacco: Not on file  Vaping Use   Vaping status: Never Used  Substance and Sexual Activity   Alcohol use: No   Drug use: No   Sexual activity: Not on file  Other Topics Concern   Not on file  Social History Narrative   Not on file   Social Drivers of Health   Financial Resource Strain: Not on file  Food Insecurity: No Food Insecurity (05/09/2023)   Hunger Vital Sign    Worried About Running Out of Food in the Last Year: Never true    Ran Out of Food in the Last Year: Never true  Transportation Needs: No Transportation Needs (05/09/2023)   PRAPARE - Administrator, Civil Service (Medical): No    Lack of Transportation (Non-Medical): No  Physical Activity: Not on file  Stress: Not on file  Social Connections: Not on file     Review of Systems: A 12 point ROS discussed and pertinent positives are indicated in the HPI above.  All other systems are negative.  Review of Systems  Constitutional:  Negative for fever.  Respiratory:  Negative for shortness of breath.   Cardiovascular:  Negative for chest pain.  Gastrointestinal:  Negative for abdominal pain, diarrhea, nausea  and vomiting.    Vital Signs: There were no vitals taken for this visit.  Physical Exam Constitutional:      General: He is not in acute distress.    Appearance: He is not ill-appearing.  HENT:     Head: Normocephalic and atraumatic.  Pulmonary:     Effort: Pulmonary effort is normal.  Abdominal:     Palpations: Abdomen is soft.  Musculoskeletal:     Cervical back: Normal range of motion.  Skin:    General: Skin is warm.     Comments: LLQ drain, suture and stat lock in place. No surrounding erythema or edema. No TTP.   Neurological:     General: No focal deficit present.      Mental Status: He is alert and oriented to person, place, and time.  Psychiatric:        Mood and Affect: Mood normal.        Thought Content: Thought content normal.      Imaging: CT GUIDED PERITONEAL/RETROPERITONEAL FLUID DRAIN BY PERC CATH Result Date: 05/17/2023 INDICATION: MIDLINE LOWER ABDOMINAL DIVERTICULAR ABSCESS EXAM: CT DRAINAGE LOWER ABDOMINAL DIVERTICULAR ABSCESS MEDICATIONS: The patient is currently admitted to the hospital and receiving intravenous antibiotics. The antibiotics were administered within an appropriate time frame prior to the initiation of the procedure. ANESTHESIA/SEDATION: Moderate (conscious) sedation was employed during this procedure. A total of Versed  2.0 mg and Fentanyl  100 mcg was administered intravenously by the radiology nurse. Total intra-service moderate Sedation Time: 20 minutes. The patient's level of consciousness and vital signs were monitored continuously by radiology nursing throughout the procedure under my direct supervision. COMPLICATIONS: None immediate PROCEDURE: Informed written consent was obtained from the patient after a thorough discussion of the procedural risks, benefits and alternatives. All questions were addressed. Maximal Sterile Barrier Technique was utilized including caps, mask, sterile gowns, sterile gloves, sterile drape, hand hygiene and skin antiseptic. A timeout was performed prior to the initiation of the procedure. Previous imaging reviewed. Patient positioned supine. Noncontrast localization CT performed through the lower abdomen. The midline diverticular abscess was localized and marked for a left anterior oblique approach. Under sterile conditions and local anesthesia, the 18 gauge 15 cm access needle was advanced from an anterior oblique approach through the rectus musculature into the abscess. Needle position confirmed with CT. Syringe aspiration yielded purulent fluid. Sample sent culture. Guidewire inserted. Guidewire  position confirmed with CT. Tract dilatation performed to insert a 10 [place] drain. Drain catheter position confirmed with CT. Syringe aspiration yielded 35 cc purulent fluid. This collapsed the abscess cavity. No immediate complication. Patient tolerated the procedure well. Catheter secured with a silk suture and a external suction bulb. Sterile dressing applied. Patient tolerated the procedure well. IMPRESSION: Successful CT-guided midline lower abdominal diverticular abscess drain placement as above. Electronically Signed   By: Melven Stable.  Shick M.D.   On: 05/17/2023 13:30   CT ABDOMEN PELVIS W CONTRAST Result Date: 05/14/2023 CLINICAL DATA:  Left lower quadrant pain EXAM: CT ABDOMEN AND PELVIS WITH CONTRAST TECHNIQUE: Multidetector CT imaging of the abdomen and pelvis was performed using the standard protocol following bolus administration of intravenous contrast. RADIATION DOSE REDUCTION: This exam was performed according to the departmental dose-optimization program which includes automated exposure control, adjustment of the mA and/or kV according to patient size and/or use of iterative reconstruction technique. CONTRAST:  OMNIPAQUE  IOHEXOL  300 MG/ML  SOLN COMPARISON:  CT abdomen and pelvis 05/09/2023. FINDINGS: Lower chest: No acute abnormality. Hepatobiliary: No focal liver abnormality is  seen. No gallstones, gallbladder wall thickening, or biliary dilatation. Pancreas: Unremarkable. No pancreatic ductal dilatation or surrounding inflammatory changes. Spleen: Normal in size without focal abnormality. Adrenals/Urinary Tract: The kidneys and adrenal glands are within normal limits. There is mild wall thickening and inflammatory stranding of the superior bladder wall. The bladder is otherwise within normal limits. Stomach/Bowel: Again seen is sigmoid colon diverticulosis with wall thickening and surrounding inflammation compatible with acute diverticulitis. Adjacent to the mid sigmoid colon in the midline  pelvis there is enhance sing fluid collection which has increased in size measuring 3.9 x 2.8 by 5.7 cm. There is marked inflammatory stranding surrounding this fluid collection. There also small foci of free air in the mesentery at this level. There is no bowel obstruction. The appendix, stomach, and small bowel loops appear within normal limits. Vascular/Lymphatic: No significant vascular findings are present. No enlarged abdominal or pelvic lymph nodes. Reproductive: Prostate is unremarkable. Other: No abdominal wall hernia or abnormality. No abdominopelvic ascites. Musculoskeletal: No acute or significant osseous findings. IMPRESSION: 1. Acute sigmoid colon diverticulitis with interval increase in size of pericolonic abscess measuring up to 5.7 cm. 2. Small foci of free air in the mesentery at the level of the abscess compatible with perforation. 3. Mild wall thickening and inflammatory stranding of the superior bladder wall may be reactive or related to cystitis. Electronically Signed   By: Tyron Gallon M.D.   On: 05/14/2023 18:19   CT ABDOMEN PELVIS W CONTRAST Result Date: 05/09/2023 CLINICAL DATA:  Sepsis. Lower abdominal pain starting a few days ago. Nausea. Loose stools. Dysuria for 2 days. EXAM: CT ABDOMEN AND PELVIS WITH CONTRAST TECHNIQUE: Multidetector CT imaging of the abdomen and pelvis was performed using the standard protocol following bolus administration of intravenous contrast. RADIATION DOSE REDUCTION: This exam was performed according to the departmental dose-optimization program which includes automated exposure control, adjustment of the mA and/or kV according to patient size and/or use of iterative reconstruction technique. CONTRAST:  OMNIPAQUE  IOHEXOL  300 MG/ML  SOLN COMPARISON:  Chest radiograph 05/09/2023 FINDINGS: Lower chest: Lung bases are clear. Hepatobiliary: Mild diffuse fatty infiltration of the liver. No focal liver lesions. Gallbladder and bile ducts are normal.  Pancreas: Unremarkable. No pancreatic ductal dilatation or surrounding inflammatory changes. Spleen: Normal in size without focal abnormality. Adrenals/Urinary Tract: Adrenal glands are unremarkable. Kidneys are normal, without renal calculi, focal lesion, or hydronephrosis. Bladder is unremarkable. Stomach/Bowel: Stomach, small bowel, and colon are mostly decompressed. Diverticulosis of the sigmoid colon. There is infiltration in the pelvic fat around the sigmoid colon. Within the sigmoid C-loop, there is an abscess with surrounding fat stranding. The abscess measures 3.1 cm diameter. Adjacent sigmoid colonic wall thickening. Changes are consistent with acute diverticulitis with pericolonic abscess. The appendix is normal. Vascular/Lymphatic: No significant vascular findings are present. No enlarged abdominal or pelvic lymph nodes. Reproductive: Prostate is unremarkable. Other: No free air in the abdomen. No free fluid. Abdominal wall musculature appears intact. Musculoskeletal: No acute or significant osseous findings. IMPRESSION: 1. Colonic diverticulosis. Acute diverticulitis of the sigmoid colon with pericolonic stranding and abscess measuring 3.1 cm diameter. 2. No evidence of bowel obstruction.  No free air. Electronically Signed   By: Boyce Byes M.D.   On: 05/09/2023 22:16   DG Chest Portable 1 View Result Date: 05/09/2023 CLINICAL DATA:  Lower abdominal pain.  Sepsis. EXAM: PORTABLE CHEST 1 VIEW COMPARISON:  February 21, 2013. FINDINGS: The heart size and mediastinal contours are within normal limits. Both lungs are clear.  The visualized skeletal structures are unremarkable. IMPRESSION: No active disease. Electronically Signed   By: Rosalene Colon M.D.   On: 05/09/2023 17:56    Labs:  CBC: Recent Labs    05/16/23 0835 05/17/23 0410 05/18/23 0402 05/19/23 0822  WBC 19.1* 19.1* 19.1* 14.8*  HGB 13.1 13.7 13.2 12.6*  HCT 42.0 43.8 42.9 40.6  PLT 467* 451* 477* 428*    COAGS: Recent  Labs    05/16/23 0433  INR 1.1    BMP: Recent Labs    05/15/23 0409 05/16/23 0835 05/17/23 0410 05/18/23 0402  NA 137 137 133* 135  K 3.4* 3.5 3.2* 3.6  CL 99 98 93* 98  CO2 25 27 26 27   GLUCOSE 123* 127* 116* 128*  BUN 9 10 9 11   CALCIUM 9.1 9.2 8.7* 9.1  CREATININE 0.77 0.72 0.77 0.74  GFRNONAA >60 >60 >60 >60    LIVER FUNCTION TESTS: Recent Labs    05/14/23 0404 05/15/23 0409 05/16/23 0835 05/17/23 0410  BILITOT 0.7 0.6 0.8 0.7  AST 21 21 14* 14*  ALT 40 37 29 25  ALKPHOS 93 89 81 82  PROT 7.5 7.5 7.8 7.9  ALBUMIN 3.0* 3.0* 3.2* 3.3*    TUMOR MARKERS: No results for input(s): "AFPTM", "CEA", "CA199", "CHROMGRNA" in the last 8760 hours.  Assessment: Lower abdominal pain/diverticular abscess   CT abdomen/pelvis performed 05/31/23 and reviewed by Dr. Wilnette Haste. injection under fluoroscopy recommended. Injection revealed fistula.  Change suction bulb to a gravity bag and discontinue flushing the drain.  Monitor for signs of infection. Including increased pain, fever, chills, nausea, vomiting and/or diarrhea.  Establish care with Gastroenterology.  Follow up in 3 weeks for another drain evaluation.  Contact IR with any questions/concerns before the next follow up visit.   Electronically Signed: Lawrance Presume PA-C 05/31/2023, 12:47 PM   Please refer to Dr. Wilnette Haste attestation of this note for management and plan.

## 2023-06-21 ENCOUNTER — Ambulatory Visit
Admission: RE | Admit: 2023-06-21 | Discharge: 2023-06-21 | Disposition: A | Discharge status: Home / Self Care | Payer: Self-pay | Source: Ambulatory Visit | Attending: Radiology | Admitting: Radiology

## 2023-06-21 ENCOUNTER — Ambulatory Visit
Admission: RE | Admit: 2023-06-21 | Discharge: 2023-06-21 | Disposition: A | Discharge status: Home / Self Care | Payer: Self-pay | Source: Ambulatory Visit | Attending: Surgery | Admitting: Surgery

## 2023-06-21 DIAGNOSIS — K5720 Diverticulitis of large intestine with perforation and abscess without bleeding: Secondary | ICD-10-CM

## 2023-06-21 HISTORY — PX: IR RADIOLOGIST EVAL & MGMT: IMG5224

## 2023-06-21 MED ORDER — IOPAMIDOL (ISOVUE-300) INJECTION 61%
100.0000 mL | Freq: Once | INTRAVENOUS | Status: AC | PRN
  Administered 2023-06-21: 100 mL via INTRAVENOUS

## 2023-06-21 NOTE — Progress Notes (Signed)
 Referring Physician(s): Melvenia Stabs   Chief Complaint: The patient is seen in follow up today s/p diverticular abscess drain placement 05/17/23 Dr. Lovell Rubenstein and   s/p surveillance CT A/P and drain injection 05/31/23 Dr. Mabel Savage   History of present illness:  Glenn Dorsey is a 38 y.o. male with history of asthma, prediabetes, obesity and diverticular abscess-s/p drain placement 05/17/23 with Dr. Lovell Rubenstein. Patient reports flushing the drain once daily. He is not currently on antibiotics.  Pt had a follow up drain check and injection on 05/31/23 with Dr. 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  The drain was switched to a gravity bag at this time.  Pt was re-scheduled for a 3 week follow up, which he present for today 06/21/23.    Past Medical History:  Diagnosis Date   Asthma    not since early 20's   Pre-diabetes     Past Surgical History:  Procedure Laterality Date   GANGLION CYST EXCISION Left 08/10/2022   Procedure: Excision of left volar carpal ganglion cyst;  Surgeon: Marilyn Shropshire, MD;  Location: [facility] OR;  Service: Orthopedics;  Laterality: Left;  regional 45   IR RADIOLOGIST EVAL & MGMT  05/31/2023   NO PAST SURGERIES      Allergies: Peanut butter flavoring agent (non-screening)  Medications: Prior to Admission medications   Medication Sig Start Date End Date Taking? Authorizing Provider  albuterol (VENTOLIN HFA) 108 (90 Base) MCG/ACT inhaler Inhale 1-2 puffs into the lungs every 4 (four) hours as needed for wheezing or shortness of breath. 06/28/22   [provider]  amLODipine  (NORVASC ) 5 MG tablet Take 1 tablet (5 mg total) by mouth daily. 05/20/23   Rizwan, Saima, MD  EPINEPHrine 0.3 mg/0.3 mL IJ SOAJ injection Inject 0.3 mg into the muscle as needed for anaphylaxis. 11/17/21   [provider]  OVER THE COUNTER MEDICATION Take 1 tablet by mouth daily. Allergen Tablet    [provider]     Family History  Problem Relation  Age of Onset   Diabetes Mother    Diabetes Father     Social History   Socioeconomic History   Marital status: Single    Spouse name: Not on file   Number of children: Not on file   Years of education: Not on file   Highest education level: Not on file  Occupational History   Not on file  Tobacco Use   Smoking status: Former    Types: Cigarettes   Smokeless tobacco: Not on file  Vaping Use   Vaping status: Never Used  Substance and Sexual Activity   Alcohol use: No   Drug use: No   Sexual activity: Not on file  Other Topics Concern   Not on file  Social History Narrative   Not on file   Social Drivers of Health   Financial Resource Strain: Not on file  Food Insecurity: No Food Insecurity (05/09/2023)   Hunger Vital Sign    Worried About Running Out of Food in the Last Year: Never true    Ran Out of Food in the Last Year: Never true  Transportation Needs: No Transportation Needs (05/09/2023)   PRAPARE - Administrator, Civil Service (Medical): No    Lack of Transportation (Non-Medical): No  Physical Activity: Not on file  Stress: Not on file  Social Connections: Not on file     Vital Signs: There were no vitals taken for this visit.  Physical  Exam Constitutional:      Appearance: Normal appearance.  HENT:     Head: Normocephalic.  Pulmonary:     Effort: Pulmonary effort is normal.  Abdominal:     Palpations: Abdomen is soft.  Skin:    General: Skin is warm and dry.     Comments: Drain site clean, dry, without signs of infection   Neurological:     General: No focal deficit present.     Mental Status: He is alert and oriented to person, place, and time.  Psychiatric:        Mood and Affect: Mood normal.        Behavior: Behavior normal.     Imaging: CT ABDOMEN PELVIS W CONTRAST Result Date: 06/21/2023 CLINICAL DATA:  39 year old with history of diverticular abscess. Percutaneous drain was placed on 05/17/2023. Previous drain injection  demonstrated a colonic fistula. Patient presents for follow-up imaging. EXAM: CT ABDOMEN AND PELVIS WITH CONTRAST TECHNIQUE: Multidetector CT imaging of the abdomen and pelvis was performed using the standard protocol following bolus administration of intravenous contrast. RADIATION DOSE REDUCTION: This exam was performed according to the departmental dose-optimization program which includes automated exposure control, adjustment of the mA and/or kV according to patient size and/or use of iterative reconstruction technique. CONTRAST:  100mL ISOVUE -300 IOPAMIDOL  (ISOVUE -300) INJECTION 61% COMPARISON:  CT abdomen pelvis 05/31/2019 FINDINGS: Lower chest: Lung bases are clear. Hepatobiliary: Normal appearance of the liver, gallbladder and portal venous system. No biliary dilatation. Pancreas: Uncinate process is slightly heterogeneous but nonspecific. No evidence for duct dilatation or acute inflammation. Spleen: Normal in size without focal abnormality. Adrenals/Urinary Tract: Normal adrenal glands. Normal appearance of both kidneys. No hydronephrosis. No suspicious renal lesions. Urinary bladder is decompressed. Inflammatory changes surrounding the bladder related to the adjacent colonic inflammation. Stomach/Bowel: Persistent inflammation and stranding around the sigmoid colon in the sigmoid mesocolon region. The percutaneous drain has retracted from the pericolonic abscess. There is a small pocket of gas where the drain was located. No significant fluid in this area but there continues to be significant inflammatory changes. Multiple colonic diverticula. Normal appendix. Normal appearance of the small bowel without dilatation. Normal appearance of the stomach. Vascular/Lymphatic: Vascular structures are unremarkable. Mildly prominent retroperitoneal lymph nodes near the aortic bifurcation and likely reactive from the pericolonic inflammation. No other significant lymph node enlargement in the abdomen or pelvis.  Reproductive: Prostate is unremarkable. Other: Negative for free fluid. Tiny locules of gas at the old drain site. The pericolonic inflammation is similar to slightly increased from the previous examination. No discrete fluid collection at this time. Anterior percutaneous drain has retracted into the left anterior abdominal subcutaneous tissues just superficial to the abdominal musculature. Musculoskeletal: No acute bone abnormality. Subcutaneous edema in the anterior abdomen. IMPRESSION: 1. Pericolonic abscess drain has retracted into the anterior abdominal subcutaneous tissues. Persistent to slightly increased inflammatory changes in the sigmoid mesocolon. Small pockets of gas where the drain was located but no significant fluid collection at this time. 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 2. Colonic diverticula. Electronically Signed   By: Elene Griffes M.D.   On: 06/21/2023 14:07    Labs:  CBC: Recent Labs    05/16/23 0835 05/17/23 0410 05/18/23 0402 05/19/23 0822  WBC 19.1* 19.1* 19.1* 14.8*  HGB 13.1 13.7 13.2 12.6*  HCT 42.0 43.8 42.9 40.6  PLT 467* 451* 477* 428*    COAGS: Recent  Labs    05/16/23 0433  INR 1.1    BMP: Recent Labs    05/15/23 0409 05/16/23 0835 05/17/23 0410 05/18/23 0402  NA 137 137 133* 135  K 3.4* 3.5 3.2* 3.6  CL 99 98 93* 98  CO2 25 27 26 27   GLUCOSE 123* 127* 116* 128*  BUN 9 10 9 11   CALCIUM 9.1 9.2 8.7* 9.1  CREATININE 0.77 0.72 0.77 0.74  GFRNONAA >60 >60 >60 >60    LIVER FUNCTION TESTS: Recent Labs    05/14/23 0404 05/15/23 0409 05/16/23 0835 05/17/23 0410  BILITOT 0.7 0.6 0.8 0.7  AST 21 21 14* 14*  ALT 40 37 29 25  ALKPHOS 93 89 81 82  PROT 7.5 7.5 7.8 7.9  ALBUMIN 3.0* 3.0* 3.2* 3.3*    Assessment:  The patient is seen in follow up today s/p diverticular abscess drain placement 05/17/23 Dr. Lovell Rubenstein and s/p surveillance  CT A/P and drain injection 05/31/23 Dr. Mabel Savage.    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    Pt presents today for 3 week follow up where CT A/P was obtained.  Imaging was reviewed by Dr. Elene Griffes.  The diverticular abscess drain is visualized in the skin only, no longer present in the original abscess placement; air is present in the space of the abscess. Upon drain injection, it is confirmed the drain is only in the skin.    Pt reports continued easy flushing until he ran out of flushes 2 days ago. Pt reports 8-10 mL output daily into the gravity bag.  Today, the gravity bag has 10 mL of gray/brown, thin fluid.    Pt reports he has had no n/v, diarrhea, or abdominal tenderness.  Pt states he had one fever about 2 weeks ago that resolved with tylenol , never re-presenting.   Dr. Julietta Ogren discussed the current disposition of the drain and drain removal with the pt, the pt agreed with the plan.     Using a suture removal kit, the external suture of the RLQ drain was cut and removed.  The RLQ 10 F drain was subsequently cut, releasing the internal suture, and removed without complication.    The RLQ drain site was cleaned and dressed appropriately.   Dr. Julietta Ogren would like Glenn Dorsey to follow up with Dr. Camilo Cella of General Surgery for continued surveillance.  Glenn Dorsey states he has a follow up at the end of July, post colonoscopy.    I have personally reached out to Dr. Maurie Southern PA, Michael Maczis, to set up a follow up sooner.    Signed: Alleene Stoy C Maie Kesinger, PA-C 06/21/2023, 2:12 PM

## 2023-08-09 ENCOUNTER — Ambulatory Visit: Payer: Self-pay | Admitting: Gastroenterology

## 2023-08-09 NOTE — Progress Notes (Deleted)
 [facility] Consult Note:  History: Glenn Dorsey 08/09/2023  Referring provider: Lenon Nell SAILOR, FNP  Reason for consult/chief complaint: No chief complaint on file.   Subjective  HPI: Glenn Dorsey admission April 2025 for sigmoid diverticulitis with pericolonic abscess requiring IR drainage and surgical consultation.  Gram-positive bacteremia treated with 14-day course antibiotics Discharge summary indicates patient given contact info for [facility] for posthospital follow-up and to plan a colonoscopy. Clinic follow-up in early June with Dr. Teresa at [facility] with drain still in place.  Patient had not seen GI and was then referred to us .  Dr. 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 Philip: 38 year old with history of a diverticular abscess and status post percutaneous drain placement.  Prior drain injection demonstrated a colonic fistula.  Patient returned for follow-up visit today.  Overall, the patient is doing well and reported minimal output from the drain.  However, the CT of the abdomen and pelvis demonstrated that the drain has retracted into the anterior abdominal subcutaneous tissues.  Patient continues to have a large amount of inflammatory changes around the colon and the sigmoid mesocolon.  Small pockets of gas where the drain used to be.  There is not a large fluid or abscess collection at this time.  We performed a drain injection because there was still some brown output in the drain and I thought there may be visualized sinus track around the colon.  However, contrast was preferentially draining back onto the skin and not into the intra-abdominal cavity.  Based on the imaging findings, I felt that it would be very difficult to replace this drain through the existing access.  Therefore, the drain was removed.  I explained to the patient that he could still have a colonic  fistula and he is at risk for a recurrent abscess.  We instructed him to let us  know if he is having recurrent symptoms such as abdominal pain, diarrhea, fevers or chills.  Patient will follow-up with general surgery.   A copy of this report was sent to the requesting provider on this date.   Electronically Signed: Juliene JONELLE Glenn Dorsey 06/21/2023, 5:40 PM ***   ROS:  Review of Systems   Past Medical History: Past Medical History:  Diagnosis Date   Asthma    not since early 20's   Pre-diabetes      Past Surgical History: Past Surgical History:  Procedure Laterality Date   GANGLION CYST EXCISION Left 08/10/2022   Procedure: Excision of left volar carpal ganglion cyst;  Surgeon: Romona Harari, MD;  Location: [facility] OR;  Service: Orthopedics;  Laterality: Left;  regional 45   IR RADIOLOGIST EVAL & MGMT  05/31/2023   IR RADIOLOGIST EVAL & MGMT  06/21/2023   NO PAST SURGERIES       Family History: Family History  Problem Relation Age of Onset   Diabetes Mother    Diabetes Father     Social History: Social History   Socioeconomic History   Marital status: Single    Spouse name: Not on file   Number of children: Not on file   Years of education: Not on file   Highest education level: Not on file  Occupational History   Not on file  Tobacco Use   Smoking status: Former    Types: Cigarettes   Smokeless tobacco: Not on file  Vaping Use   Vaping status: Never  Used  Substance and Sexual Activity   Alcohol use: No   Drug use: No   Sexual activity: Not on file  Other Topics Concern   Not on file  Social History Narrative   Not on file   Social Drivers of Health   Financial Resource Strain: Not on file  Food Insecurity: No Food Insecurity (05/09/2023)   Hunger Vital Sign    Worried About Running Out of Food in the Last Year: Never true    Ran Out of Food in the Last Year: Never true  Transportation Needs: No Transportation Needs (05/09/2023)   PRAPARE - Therapist, art (Medical): No    Lack of Transportation (Non-Medical): No  Physical Activity: Not on file  Stress: Not on file  Social Connections: Not on file    Allergies: Allergies  Allergen Reactions   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     amLODipine  (NORVASC ) 5 MG tablet Take 1 tablet (5 mg total) by mouth daily. 30 tablet 0   EPINEPHrine 0.3 mg/0.3 mL IJ SOAJ injection Inject 0.3 mg into the muscle as needed for anaphylaxis.     OVER THE COUNTER MEDICATION Take 1 tablet by mouth daily. Allergen Tablet     No current facility-administered medications for this visit.      ___________________________________________________________________ Objective   Exam:  There were no vitals taken for this visit. Wt Readings from Last 3 Encounters:  05/09/23 (!) 310 lb (140.6 kg)  08/10/22 (!) 330 lb (149.7 kg)  06/09/21 290 lb (131.5 kg)    General: ***  Eyes: sclera anicteric, no redness ENT: oral mucosa moist without lesions, no cervical or supraclavicular lymphadenopathy CV: ***, no JVD, no peripheral edema Resp: clear to auscultation bilaterally, normal RR and effort noted GI: soft, *** tenderness, with active bowel sounds. No guarding or palpable organomegaly noted. Skin; warm and dry, no rash or jaundice noted Neuro: awake, alert and oriented x 3. Normal gross motor function and fluent speech  Labs:  ***  Radiologic Studies:  CLINICAL DATA:  38 year old with history of diverticular abscess. Percutaneous drain was placed on 05/17/2023. Previous drain injection demonstrated a colonic fistula. Patient presents for follow-up imaging.   EXAM: CT ABDOMEN AND PELVIS WITH CONTRAST    TECHNIQUE: Multidetector CT imaging of the abdomen and pelvis was performed using the standard protocol following bolus administration of intravenous contrast.   RADIATION DOSE REDUCTION: This exam was performed according to the departmental dose-optimization program which includes automated exposure control, adjustment of the mA and/or kV according to patient size and/or use of iterative reconstruction technique.   CONTRAST:  100mL ISOVUE -300 IOPAMIDOL  (ISOVUE -300) INJECTION 61%   COMPARISON:  CT abdomen pelvis 05/31/2019   FINDINGS: Lower chest: Lung bases are clear.   Hepatobiliary: Normal appearance of the liver, gallbladder and portal venous system. No biliary dilatation.   Pancreas: Uncinate process is slightly heterogeneous but nonspecific. No evidence for duct dilatation or acute inflammation.   Spleen: Normal in size without focal abnormality.   Adrenals/Urinary Tract: Normal adrenal glands. Normal appearance of both kidneys. No hydronephrosis. No suspicious renal lesions. Urinary bladder is decompressed. Inflammatory changes surrounding the bladder related to the adjacent colonic inflammation.   Stomach/Bowel: Persistent inflammation and stranding  around the sigmoid colon in the sigmoid mesocolon region. The percutaneous drain has retracted from the pericolonic abscess. There is a small pocket of gas where the drain was located. No significant fluid in this area but there continues to be significant inflammatory changes. Multiple colonic diverticula. Normal appendix. Normal appearance of the small bowel without dilatation. Normal appearance of the stomach.   Vascular/Lymphatic: Vascular structures are unremarkable. Mildly prominent retroperitoneal lymph nodes near the aortic bifurcation and likely reactive from the pericolonic inflammation. No other significant lymph node enlargement in the abdomen or pelvis.   Reproductive: Prostate is unremarkable.   Other:  Negative for free fluid. Tiny locules of gas at the old drain site. The pericolonic inflammation is similar to slightly increased from the previous examination. No discrete fluid collection at this time. Anterior percutaneous drain has retracted into the left anterior abdominal subcutaneous tissues just superficial to the abdominal musculature.   Musculoskeletal: No acute bone abnormality. Subcutaneous edema in the anterior abdomen.   IMPRESSION: 1. Pericolonic abscess drain has retracted into the anterior abdominal subcutaneous tissues. Persistent to slightly increased inflammatory changes in the sigmoid mesocolon. Small pockets of gas where the drain was located but no significant fluid collection at this time. 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 2. Colonic diverticula.     Electronically Signed   By: Juliene Balder M.D.   On: 06/21/2023 14:07  Assessment: No diagnosis found.  ***  Plan:  ***  Thank you for the courtesy of this consult.  Please call me with any questions or concerns.  Victory LITTIE Brand III  CC: Referring provider noted above
